# Patient Record
Sex: Female | Born: 1960 | Race: Black or African American | Hispanic: No | Marital: Single | Smoking: Never smoker
Health system: Southern US, Community
[De-identification: ages and names within clinical notes are randomized; demographics above are authoritative.]

## PROBLEM LIST (undated history)

## (undated) DIAGNOSIS — E119 Type 2 diabetes mellitus without complications: Secondary | ICD-10-CM

## (undated) DIAGNOSIS — I10 Essential (primary) hypertension: Secondary | ICD-10-CM

---

## 2006-03-20 ENCOUNTER — Emergency Department (HOSPITAL_COMMUNITY): Admission: EM | Admit: 2006-03-20 | Discharge: 2006-03-20 | Payer: Self-pay | Admitting: Emergency Medicine

## 2006-04-03 ENCOUNTER — Other Ambulatory Visit: Payer: Self-pay

## 2006-04-13 ENCOUNTER — Inpatient Hospital Stay: Payer: Self-pay | Admitting: Unknown Physician Specialty

## 2010-02-11 ENCOUNTER — Ambulatory Visit: Payer: Self-pay | Admitting: Internal Medicine

## 2010-02-17 ENCOUNTER — Ambulatory Visit: Payer: Self-pay | Admitting: Cardiovascular Disease

## 2010-09-08 ENCOUNTER — Ambulatory Visit: Payer: Self-pay | Admitting: Unknown Physician Specialty

## 2010-09-13 ENCOUNTER — Ambulatory Visit: Payer: Self-pay | Admitting: Unknown Physician Specialty

## 2010-09-15 ENCOUNTER — Ambulatory Visit: Payer: Self-pay | Admitting: Unknown Physician Specialty

## 2011-03-28 ENCOUNTER — Ambulatory Visit: Payer: Self-pay | Admitting: Family

## 2011-05-10 ENCOUNTER — Ambulatory Visit: Payer: Self-pay | Admitting: Internal Medicine

## 2012-06-09 ENCOUNTER — Emergency Department: Payer: Self-pay | Admitting: Emergency Medicine

## 2012-06-09 LAB — CBC
HCT: 38.8 % (ref 35.0–47.0)
MCHC: 32.5 g/dL (ref 32.0–36.0)
RBC: 4.54 10*6/uL (ref 3.80–5.20)
RDW: 14.3 % (ref 11.5–14.5)
WBC: 11.4 10*3/uL — ABNORMAL HIGH (ref 3.6–11.0)

## 2012-06-09 LAB — COMPREHENSIVE METABOLIC PANEL
Albumin: 3.9 g/dL (ref 3.4–5.0)
Alkaline Phosphatase: 93 U/L (ref 50–136)
Bilirubin,Total: 0.4 mg/dL (ref 0.2–1.0)
Co2: 34 mmol/L — ABNORMAL HIGH (ref 21–32)
EGFR (Non-African Amer.): 59 — ABNORMAL LOW
Glucose: 111 mg/dL — ABNORMAL HIGH (ref 65–99)
Osmolality: 278 (ref 275–301)
Total Protein: 8.9 g/dL — ABNORMAL HIGH (ref 6.4–8.2)

## 2012-06-09 LAB — URINALYSIS, COMPLETE
Hyaline Cast: 3
Ketone: NEGATIVE
Nitrite: NEGATIVE
Ph: 7 (ref 4.5–8.0)
Protein: NEGATIVE
Specific Gravity: 1.005 (ref 1.003–1.030)

## 2013-10-17 DIAGNOSIS — I10 Essential (primary) hypertension: Secondary | ICD-10-CM | POA: Diagnosis present

## 2018-08-20 ENCOUNTER — Other Ambulatory Visit: Payer: Self-pay | Admitting: Family Medicine

## 2018-08-20 DIAGNOSIS — Z1231 Encounter for screening mammogram for malignant neoplasm of breast: Secondary | ICD-10-CM

## 2018-09-14 ENCOUNTER — Encounter: Payer: Self-pay | Admitting: Radiology

## 2018-09-14 ENCOUNTER — Ambulatory Visit
Admission: RE | Admit: 2018-09-14 | Discharge: 2018-09-14 | Disposition: A | Discharge status: Home / Self Care | Payer: BLUE CROSS/BLUE SHIELD | Source: Ambulatory Visit | Attending: Family Medicine | Admitting: Family Medicine

## 2018-09-14 DIAGNOSIS — Z1231 Encounter for screening mammogram for malignant neoplasm of breast: Secondary | ICD-10-CM

## 2020-04-16 ENCOUNTER — Emergency Department: Payer: Self-pay

## 2020-04-16 ENCOUNTER — Encounter: Payer: Self-pay | Admitting: Emergency Medicine

## 2020-04-16 DIAGNOSIS — R9431 Abnormal electrocardiogram [ECG] [EKG]: Secondary | ICD-10-CM | POA: Insufficient documentation

## 2020-04-16 DIAGNOSIS — Z20822 Contact with and (suspected) exposure to covid-19: Secondary | ICD-10-CM | POA: Insufficient documentation

## 2020-04-16 DIAGNOSIS — R531 Weakness: Secondary | ICD-10-CM | POA: Insufficient documentation

## 2020-04-16 DIAGNOSIS — E1165 Type 2 diabetes mellitus with hyperglycemia: Secondary | ICD-10-CM | POA: Insufficient documentation

## 2020-04-16 DIAGNOSIS — I10 Essential (primary) hypertension: Secondary | ICD-10-CM | POA: Insufficient documentation

## 2020-04-16 DIAGNOSIS — Z91040 Latex allergy status: Secondary | ICD-10-CM | POA: Insufficient documentation

## 2020-04-16 LAB — CBC
HCT: 37 % (ref 36.0–46.0)
Hemoglobin: 12.2 g/dL (ref 12.0–15.0)
MCH: 28.1 pg (ref 26.0–34.0)
MCHC: 33 g/dL (ref 30.0–36.0)
MCV: 85.3 fL (ref 80.0–100.0)
Platelets: 282 10*3/uL (ref 150–400)
RBC: 4.34 MIL/uL (ref 3.87–5.11)
RDW: 13.9 % (ref 11.5–15.5)
WBC: 8.4 10*3/uL (ref 4.0–10.5)
nRBC: 0 % (ref 0.0–0.2)

## 2020-04-16 LAB — TROPONIN I (HIGH SENSITIVITY): Troponin I (High Sensitivity): 7 ng/L (ref <18)

## 2020-04-16 LAB — BASIC METABOLIC PANEL
Anion gap: 12 (ref 5–15)
BUN: 12 mg/dL (ref 6–20)
CO2: 22 mmol/L (ref 22–32)
Calcium: 9.2 mg/dL (ref 8.9–10.3)
Chloride: 104 mmol/L (ref 98–111)
Creatinine, Ser: 0.94 mg/dL (ref 0.44–1.00)
GFR calc Af Amer: >60 mL/min (ref >60)
GFR calc non Af Amer: >60 mL/min (ref >60)
Glucose, Bld: 284 mg/dL — ABNORMAL HIGH (ref 70–99)
Potassium: 3.3 mmol/L — ABNORMAL LOW (ref 3.5–5.1)
Sodium: 138 mmol/L (ref 135–145)

## 2020-04-16 NOTE — ED Triage Notes (Signed)
Pt reports she was transferred by EMS from her PCP to [facility] ER this AM due to EKG changes and hyperglycemia. Pt left AMA from [facility] due to the wait time. Pt reports she has had weakness, dizziness, hyperglycemia and hypertension x3 days. Pt takes metformin only for DM.

## 2020-04-17 ENCOUNTER — Emergency Department
Admission: EM | Admit: 2020-04-17 | Discharge: 2020-04-17 | Disposition: A | Discharge status: Home / Self Care | Payer: Self-pay | Attending: Emergency Medicine | Admitting: Emergency Medicine

## 2020-04-17 DIAGNOSIS — R9431 Abnormal electrocardiogram [ECG] [EKG]: Secondary | ICD-10-CM

## 2020-04-17 DIAGNOSIS — R739 Hyperglycemia, unspecified: Secondary | ICD-10-CM

## 2020-04-17 HISTORY — DX: Essential (primary) hypertension: I10

## 2020-04-17 HISTORY — DX: Type 2 diabetes mellitus without complications: E11.9

## 2020-04-17 LAB — URINALYSIS, COMPLETE (UACMP) WITH MICROSCOPIC
Bacteria, UA: NONE SEEN
Bilirubin Urine: NEGATIVE
Glucose, UA: >=500 mg/dL — AB
Hgb urine dipstick: NEGATIVE
Ketones, ur: 5 mg/dL — AB
Leukocytes,Ua: NEGATIVE
Nitrite: NEGATIVE
Protein, ur: NEGATIVE mg/dL
Specific Gravity, Urine: 1.022 (ref 1.005–1.030)
pH: 7 (ref 5.0–8.0)

## 2020-04-17 LAB — GLUCOSE, CAPILLARY
Glucose-Capillary: 284 mg/dL — ABNORMAL HIGH (ref 70–99)
Glucose-Capillary: 280 mg/dL — ABNORMAL HIGH (ref 70–99)

## 2020-04-17 LAB — TROPONIN I (HIGH SENSITIVITY): Troponin I (High Sensitivity): 7 ng/L (ref <18)

## 2020-04-17 LAB — SARS CORONAVIRUS 2 (TAT 6-24 HRS): SARS Coronavirus 2: NEGATIVE

## 2020-04-17 MED ORDER — LACTATED RINGERS IV BOLUS
1000.0000 mL | Freq: Once | INTRAVENOUS | Status: AC
  Administered 2020-04-17: 1000 mL via INTRAVENOUS

## 2020-04-17 NOTE — ED Provider Notes (Signed)
[facility] Emergency Department Provider Note   ____________________________________________   First MD Initiated Contact with Patient 04/17/20 [phone]     (approximate)  I have reviewed the triage vital signs and the nursing notes.   HISTORY  Chief Complaint Hyperglycemia, Dizziness, and Hypertension    HPI Regina Barry is a 59 y.o. female with past medical history of hypertension and diabetes who presents to the ED complaining of hyperglycemia and weakness.  Patient reports that she has been feeling with it due to frequent posterior 3 days, has noticed that her blood sugar has been elevated since earlier this week.  It has gotten as high as 413 at home and she states she has been compliant with her usual Metformin.  She has not had any fevers but has been feeling weak and malaised with occasional sweating and diarrhea.  She denies any nausea, vomiting, abdominal pain, dysuria, hematuria, cough, chest pain, or shortness of breath.  She has previously been vaccinated for COVID-19 and denies any sick contacts.  She initially presented to walk-in clinic at her PCPs office, who noticed changes on her EKG and referred her to the ED at [facility].  She left the [facility] waiting room prior to being seen.        Past Medical History:  Diagnosis Date  . Diabetes mellitus without complication (HCC)   . Hypertension     There are no problems to display for this patient.   History reviewed. No pertinent surgical history.  Prior to Admission medications   Not on File    Allergies Diflucortolone, Doxycycline, Latex, and Penicillins  Family History  Problem Relation Age of Onset  . Breast cancer Mother 50  . Breast cancer Paternal Aunt     Social History Social History   Tobacco Use  . Smoking status: Never Smoker  . Smokeless tobacco: Never Used  Substance Use Topics  . Alcohol use: Not on file  . Drug use: Not on file    Review of  Systems  Constitutional: No fever/chills.  Positive for generalized weakness and malaise. Eyes: No visual changes. ENT: No sore throat. Cardiovascular: Denies chest pain. Respiratory: Denies shortness of breath. Gastrointestinal: No abdominal pain.  No nausea, no vomiting.  Positive for diarrhea.  No constipation. Genitourinary: Negative for dysuria. Musculoskeletal: Negative for back pain. Skin: Negative for rash. Neurological: Negative for headaches, focal weakness or numbness.  ____________________________________________   PHYSICAL EXAM:  VITAL SIGNS: ED Triage Vitals  Enc Vitals Group     BP 04/16/20 2011 (!) 200/101     Pulse Rate 04/16/20 2011 91     Resp 04/16/20 2011 18     Temp 04/16/20 2011 99 F (37.2 C)     Temp Source 04/16/20 2011 Oral     SpO2 04/16/20 2011 99 %     Weight --      Height --      Head Circumference --      Peak Flow --      Pain Score 04/17/20 0158 4     Pain Loc --      Pain Edu? --      Excl. in GC? --     Constitutional: Alert and oriented. Eyes: Conjunctivae are normal. Head: Atraumatic. Nose: No congestion/rhinnorhea. Mouth/Throat: Mucous membranes are moist. Neck: Normal ROM Cardiovascular: Normal rate, regular rhythm. Grossly normal heart sounds. Respiratory: Normal respiratory effort.  No retractions. Lungs CTAB. Gastrointestinal: Soft and nontender. No distention. Genitourinary: deferred Musculoskeletal: No lower  extremity tenderness nor edema. Neurologic:  Normal speech and language. No gross focal neurologic deficits are appreciated. Skin:  Skin is warm, dry and intact. No rash noted. Psychiatric: Mood and affect are normal. Speech and behavior are normal.  ____________________________________________   LABS (all labs ordered are listed, but only abnormal results are displayed)  Labs Reviewed  BASIC METABOLIC PANEL - Abnormal; Notable for the following components:      Result Value   Potassium 3.3 (*)    Glucose,  Bld 284 (*)    All other components within normal limits  GLUCOSE, CAPILLARY - Abnormal; Notable for the following components:   Glucose-Capillary 284 (*)    All other components within normal limits  URINALYSIS, COMPLETE (UACMP) WITH MICROSCOPIC - Abnormal; Notable for the following components:   Color, Urine YELLOW (*)    APPearance CLOUDY (*)    Glucose, UA >=500 (*)    Ketones, ur 5 (*)    All other components within normal limits  SARS CORONAVIRUS 2 (TAT 6-24 HRS)  CBC  TROPONIN I (HIGH SENSITIVITY)  TROPONIN I (HIGH SENSITIVITY)  TROPONIN I (HIGH SENSITIVITY)   ____________________________________________  EKG  ED ECG REPORT I, Chesley Noon, the attending physician, personally viewed and interpreted this ECG.   Date: 04/17/2020  EKG Time: 20:14  Rate: 91  Rhythm: normal sinus rhythm  Axis: Normal  Intervals:none  ST&T Change: Lateral T wave inversions   PROCEDURES  Procedure(s) performed (including Critical Care):  Procedures   ____________________________________________   INITIAL IMPRESSION / ASSESSMENT AND PLAN / ED COURSE       59 year old female with past medical history of hypertension and diabetes presents to the ED complaining of generalized weakness, malaise, diarrhea, and hyperglycemia noted over the past 2 to 3 days.  She denies any fevers but has felt hot and sweaty at times.  Vital signs are reassuring here in the ED, lab work remarkable for mild hyperglycemia but otherwise reassuring.  EKG does show T wave inversions in lateral leads, however there is no recent EKG for comparison and this may be a chronic finding given patient denies any anginal symptoms and initial troponin is negative.  We will trend second troponin, hydrate with IV fluids, and check UA.  If this is unremarkable then I suspect viral illness could be contributing to patient's hyperglycemia, we will test for COVID-19.  Repeat troponin within normal limits, UA is unremarkable.   Patient is appropriate for discharge home with PCP follow-up for hyperglycemia, was counseled to return to the ED for new worsening symptoms.  Patient agrees with plan.      ____________________________________________   FINAL CLINICAL IMPRESSION(S) / ED DIAGNOSES  Final diagnoses:  Hyperglycemia  Abnormal EKG     ED Discharge Orders    None       Note:  This document was prepared using Dragon voice recognition software and may include unintentional dictation errors.   Chesley Noon, MD 04/17/20 [phone]

## 2020-04-17 NOTE — ED Notes (Signed)
Pt given warm blanket.

## 2020-09-08 ENCOUNTER — Other Ambulatory Visit: Payer: Self-pay | Admitting: Family Medicine

## 2020-09-08 DIAGNOSIS — Z1231 Encounter for screening mammogram for malignant neoplasm of breast: Secondary | ICD-10-CM

## 2020-09-10 ENCOUNTER — Other Ambulatory Visit: Payer: Self-pay

## 2020-09-10 ENCOUNTER — Ambulatory Visit
Admission: RE | Admit: 2020-09-10 | Discharge: 2020-09-10 | Disposition: A | Discharge status: Home / Self Care | Payer: BC Managed Care – PPO | Source: Ambulatory Visit | Attending: Family Medicine | Admitting: Family Medicine

## 2020-09-10 DIAGNOSIS — Z1231 Encounter for screening mammogram for malignant neoplasm of breast: Secondary | ICD-10-CM | POA: Insufficient documentation

## 2021-02-23 DIAGNOSIS — E119 Type 2 diabetes mellitus without complications: Secondary | ICD-10-CM | POA: Insufficient documentation

## 2021-02-23 DIAGNOSIS — R079 Chest pain, unspecified: Secondary | ICD-10-CM | POA: Diagnosis not present

## 2021-02-23 DIAGNOSIS — Z91040 Latex allergy status: Secondary | ICD-10-CM | POA: Insufficient documentation

## 2021-02-23 DIAGNOSIS — R519 Headache, unspecified: Secondary | ICD-10-CM | POA: Insufficient documentation

## 2021-02-23 DIAGNOSIS — I10 Essential (primary) hypertension: Secondary | ICD-10-CM | POA: Diagnosis not present

## 2021-02-23 DIAGNOSIS — R55 Syncope and collapse: Secondary | ICD-10-CM | POA: Insufficient documentation

## 2021-02-23 DIAGNOSIS — Z20822 Contact with and (suspected) exposure to covid-19: Secondary | ICD-10-CM | POA: Diagnosis not present

## 2021-02-23 DIAGNOSIS — R0602 Shortness of breath: Secondary | ICD-10-CM | POA: Insufficient documentation

## 2021-02-24 ENCOUNTER — Emergency Department
Admission: EM | Admit: 2021-02-24 | Discharge: 2021-02-24 | Disposition: A | Discharge status: Home / Self Care | Payer: BC Managed Care – PPO | Attending: Emergency Medicine | Admitting: Emergency Medicine

## 2021-02-24 ENCOUNTER — Emergency Department: Payer: BC Managed Care – PPO

## 2021-02-24 ENCOUNTER — Other Ambulatory Visit: Payer: Self-pay

## 2021-02-24 DIAGNOSIS — R55 Syncope and collapse: Secondary | ICD-10-CM

## 2021-02-24 DIAGNOSIS — E860 Dehydration: Secondary | ICD-10-CM

## 2021-02-24 LAB — HEPATIC FUNCTION PANEL
ALT: 12 U/L (ref 0–44)
AST: 11 U/L — ABNORMAL LOW (ref 15–41)
Albumin: 3.7 g/dL (ref 3.5–5.0)
Alkaline Phosphatase: 60 U/L (ref 38–126)
Bilirubin, Direct: <0.1 mg/dL (ref 0.0–0.2)
Total Bilirubin: 0.6 mg/dL (ref 0.3–1.2)
Total Protein: 7.1 g/dL (ref 6.5–8.1)

## 2021-02-24 LAB — CBC WITH DIFFERENTIAL/PLATELET
Abs Immature Granulocytes: 0.02 10*3/uL (ref 0.00–0.07)
Basophils Absolute: 0 10*3/uL (ref 0.0–0.1)
Basophils Relative: 1 %
Eosinophils Absolute: 0.1 10*3/uL (ref 0.0–0.5)
Eosinophils Relative: 2 %
HCT: 36.2 % (ref 36.0–46.0)
Hemoglobin: 11.6 g/dL — ABNORMAL LOW (ref 12.0–15.0)
Immature Granulocytes: 0 %
Lymphocytes Relative: 47 %
Lymphs Abs: 3.9 10*3/uL (ref 0.7–4.0)
MCH: 27.2 pg (ref 26.0–34.0)
MCHC: 32 g/dL (ref 30.0–36.0)
MCV: 85 fL (ref 80.0–100.0)
Monocytes Absolute: 0.4 10*3/uL (ref 0.1–1.0)
Monocytes Relative: 5 %
Neutro Abs: 3.6 10*3/uL (ref 1.7–7.7)
Neutrophils Relative %: 45 %
Platelets: 284 10*3/uL (ref 150–400)
RBC: 4.26 MIL/uL (ref 3.87–5.11)
RDW: 14.7 % (ref 11.5–15.5)
WBC: 8.1 10*3/uL (ref 4.0–10.5)
nRBC: 0 % (ref 0.0–0.2)

## 2021-02-24 LAB — URINALYSIS, COMPLETE (UACMP) WITH MICROSCOPIC
Bilirubin Urine: NEGATIVE
Glucose, UA: 50 mg/dL — AB
Hgb urine dipstick: NEGATIVE
Ketones, ur: NEGATIVE mg/dL
Leukocytes,Ua: NEGATIVE
Nitrite: NEGATIVE
Protein, ur: 30 mg/dL — AB
Specific Gravity, Urine: 1.033 — ABNORMAL HIGH (ref 1.005–1.030)
pH: 5 (ref 5.0–8.0)

## 2021-02-24 LAB — BASIC METABOLIC PANEL
Anion gap: 11 (ref 5–15)
BUN: 15 mg/dL (ref 6–20)
CO2: 19 mmol/L — ABNORMAL LOW (ref 22–32)
Calcium: 9.3 mg/dL (ref 8.9–10.3)
Chloride: 107 mmol/L (ref 98–111)
Creatinine, Ser: 1.1 mg/dL — ABNORMAL HIGH (ref 0.44–1.00)
GFR, Estimated: 59 mL/min — ABNORMAL LOW (ref >60)
Glucose, Bld: 327 mg/dL — ABNORMAL HIGH (ref 70–99)
Potassium: 3.4 mmol/L — ABNORMAL LOW (ref 3.5–5.1)
Sodium: 137 mmol/L (ref 135–145)

## 2021-02-24 LAB — MAGNESIUM: Magnesium: 1.9 mg/dL (ref 1.7–2.4)

## 2021-02-24 LAB — TROPONIN I (HIGH SENSITIVITY)
Troponin I (High Sensitivity): 6 ng/L (ref <18)
Troponin I (High Sensitivity): 6 ng/L (ref <18)

## 2021-02-24 LAB — RESP PANEL BY RT-PCR (FLU A&B, COVID) ARPGX2
Influenza A by PCR: NEGATIVE
Influenza B by PCR: NEGATIVE
SARS Coronavirus 2 by RT PCR: NEGATIVE

## 2021-02-24 LAB — BRAIN NATRIURETIC PEPTIDE: B Natriuretic Peptide: 7.5 pg/mL (ref 0.0–100.0)

## 2021-02-24 LAB — D-DIMER, QUANTITATIVE: D-Dimer, Quant: 0.87 ug/mL-FEU — ABNORMAL HIGH (ref 0.00–0.50)

## 2021-02-24 MED ORDER — ACETAMINOPHEN 500 MG PO TABS
1000.0000 mg | ORAL_TABLET | Freq: Once | ORAL | Status: AC
  Administered 2021-02-24: 1000 mg via ORAL
  Filled 2021-02-24: qty 2

## 2021-02-24 MED ORDER — HYDROCODONE BIT-HOMATROP MBR 5-1.5 MG/5ML PO SOLN: 5.0000 mL | Freq: Four times a day (QID) | ORAL | 0 refills | Status: DC | PRN

## 2021-02-24 MED ORDER — SODIUM CHLORIDE 0.9 % IV BOLUS
1000.0000 mL | Freq: Once | INTRAVENOUS | Status: AC
  Administered 2021-02-24: 1000 mL via INTRAVENOUS

## 2021-02-24 MED ORDER — POTASSIUM CHLORIDE CRYS ER 20 MEQ PO TBCR
40.0000 meq | EXTENDED_RELEASE_TABLET | Freq: Once | ORAL | Status: AC
  Administered 2021-02-24: 40 meq via ORAL
  Filled 2021-02-24: qty 2

## 2021-02-24 MED ORDER — TECHNETIUM TO 99M ALBUMIN AGGREGATED
4.2000 | Freq: Once | INTRAVENOUS | Status: AC | PRN
  Administered 2021-02-24: 4.2 via INTRAVENOUS

## 2021-02-24 MED ORDER — BENZONATATE 100 MG PO CAPS: 100.0000 mg | ORAL_CAPSULE | Freq: Three times a day (TID) | ORAL | 0 refills | Status: AC | PRN

## 2021-02-24 NOTE — ED Notes (Signed)
Pt being transported to nuc med at this time

## 2021-02-24 NOTE — Discharge Instructions (Addendum)
Follow-up with cardiology to discuss Holter and or echo for your episode.  If develop worsening shortness of breath or repeat passing out please return to the ER medially  Hold your LASIX for the next 2 days  Drink AT LEAST 6-8 glasses of water daily  Take the whole course of your antibiotic  I've prescribed something for cough

## 2021-02-24 NOTE — ED Provider Notes (Signed)
[facility] Emergency Department Provider Note  ____________________________________________   Event Date/Time   First MD Initiated Contact with Patient 02/24/21 [phone]     (approximate)  I have reviewed the triage vital signs and the nursing notes.   HISTORY  Chief Complaint Loss of Consciousness    HPI Regina Barry is a 60 y.o. female with diabetes, hypertension who comes in with syncopal episode.  Patient was recently diagnosed with pneumonia is currently taking Levaquin.  Patient states that she was diagnosed after a primary doctor heard that she was having coughing for 3 weeks and listen to her lungs.  No imaging was ordered.  Patient reports that today she had gone to the bathroom only urination and she started to feel unwell.  She was walking and felt like she was going to pass out and had some chest pain, shortness of breath.  She states that she had brief LOC where she crumpled down to the ground.  Did not hit her head.  She does report a mild headache but states that it been ever since she took the Levaquin.  No change in her headache since then.  No blurry vision.  No hematoma on her head.  No neck pain.  She states that she felt like her legs lost their color.  She denies any urinary self, tongue biting, shaking activity          Past Medical History:  Diagnosis Date  . Diabetes mellitus without complication (HCC)   . Hypertension     There are no problems to display for this patient.   History reviewed. No pertinent surgical history.  Prior to Admission medications   Not on File    Allergies Diflucortolone, Doxycycline, Latex, and Penicillins  Family History  Problem Relation Age of Onset  . Breast cancer Mother 3  . Breast cancer Paternal Aunt   . Breast cancer Cousin     Social History Social History   Tobacco Use  . Smoking status: Never Smoker  . Smokeless tobacco: Never Used      Review of  Systems Constitutional: No fever/chills + syncope  Eyes: No visual changes. ENT: No sore throat. Cardiovascular: Positive chest pain Respiratory: + sob  Gastrointestinal: No abdominal pain.  No nausea, no vomiting.  No diarrhea.  No constipation. Genitourinary: Negative for dysuria. Musculoskeletal: Negative for back pain. Skin: Negative for rash. Neurological: + headache, no focal weakness or numbness. All other ROS negative ____________________________________________   PHYSICAL EXAM:  VITAL SIGNS: ED Triage Vitals  Enc Vitals Group     BP 02/24/21 0007 (!) 157/86     Pulse Rate 02/24/21 0007 92     Resp 02/24/21 0007 18     Temp 02/24/21 0007 98.7 F (37.1 C)     Temp src --      SpO2 02/24/21 0007 97 %     Weight 02/24/21 0005 212 lb (96.2 kg)     Height 02/24/21 0005 5\' 4"  (1.626 m)     Head Circumference --      Peak Flow --      Pain Score 02/24/21 0005 4     Pain Loc --      Pain Edu? --      Excl. in GC? --     Constitutional: Alert and oriented. Well appearing and in no acute distress. Eyes: Conjunctivae are normal. EOMI. Head: Atraumatic. Nose: No congestion/rhinnorhea. Mouth/Throat: Mucous membranes are moist.   Neck: No stridor. Trachea Midline.  FROM Cardiovascular: Normal rate, regular rhythm. Grossly normal heart sounds.  Good peripheral circulation. Respiratory: Normal respiratory effort.  No retractions. Lungs CTAB. Gastrointestinal: Soft and nontender. No distention. No abdominal bruits.  Musculoskeletal: No lower extremity tenderness nor edema.  No joint effusions.  2+ distal pulses in her feet.  No color change.  No calf swelling or pain Neurologic:  Normal speech and language. No gross focal neurologic deficits are appreciated.  Equal strength in arms and legs. Skin:  Skin is warm, dry and intact. No rash noted. Psychiatric: Mood and affect are normal. Speech and behavior are normal. GU: Deferred   ____________________________________________    LABS (all labs ordered are listed, but only abnormal results are displayed)  Labs Reviewed  BASIC METABOLIC PANEL - Abnormal; Notable for the following components:      Result Value   Potassium 3.4 (*)    CO2 19 (*)    Glucose, Bld 327 (*)    Creatinine, Ser 1.10 (*)    GFR, Estimated 59 (*)    All other components within normal limits  CBC WITH DIFFERENTIAL/PLATELET - Abnormal; Notable for the following components:   Hemoglobin 11.6 (*)    All other components within normal limits  MAGNESIUM  URINALYSIS, COMPLETE (UACMP) WITH MICROSCOPIC  CBG MONITORING, ED  POC URINE PREG, ED   ____________________________________________   ED ECG REPORT I, Concha Se, the attending physician, personally viewed and interpreted this ECG.  Normal sinus rate of 94, no ST elevation, T wave version aVL, normal intervals ____________________________________________  RADIOLOGY Vela Prose, personally viewed and evaluated these images (plain radiographs) as part of my medical decision making, as well as reviewing the written report by the radiologist.  ED MD interpretation: No pneumonia  Official radiology report(s): DG Chest 2 View  Result Date: 02/24/2021 CLINICAL DATA:  Shortness of breath. EXAM: CHEST - 2 VIEW COMPARISON:  Chest x-ray 04/16/2020 FINDINGS: The heart size and mediastinal contours are within normal limits. Slightly low lung volumes. No focal consolidation. No pulmonary edema. No pleural effusion. No pneumothorax. No acute osseous abnormality. IMPRESSION: No active cardiopulmonary disease. Electronically Signed   By: Tish Frederickson M.D.   On: 02/24/2021 04:18    ____________________________________________   PROCEDURES  Procedure(s) performed (including Critical Care):  .1-3 Lead EKG Interpretation Performed by: Concha Se, MD Authorized by: Concha Se, MD     Interpretation: normal     ECG rate:  70s    ECG rate assessment: normal     Rhythm: sinus  rhythm     Ectopy: none     Conduction: normal       ____________________________________________   INITIAL IMPRESSION / ASSESSMENT AND PLAN / ED COURSE   Regina Barry was evaluated in Emergency Department on 02/24/2021 for the symptoms described in the history of present illness. She was evaluated in the context of the global COVID-19 pandemic, which necessitated consideration that the patient might be at risk for infection with the SARS-CoV-2 virus that causes COVID-19. Institutional protocols and algorithms that pertain to the evaluation of patients at risk for COVID-19 are in a state of rapid change based on information released by regulatory bodies including the CDC and federal and state organizations. These policies and algorithms were followed during the patient's care in the ED.    Most Likely DDx:  Patient comes in with syncopal episode in the setting of being recently treated for pneumonia with Levaquin.  Will get cardiac markers to evaluate for  ACS, D-dimer to evaluate for PE given patient reports shortness of breath and changes in colors in her legs.  At this time there is no unilateral leg swelling to suggest DVT.  Will get COVID swab.  Pulses feel good in her feet and they look well perfused with normal cap refill.  No symptoms to suggest seizures   DDx that was also considered d/t potential to cause harm, but was found less likely based on history and physical (as detailed above): -PNX (reassured with equal b/l breath sounds, CXR to evaluate) -Symptomatic anemia (will get H&H) -Aortic Dissection as no tearing pain and no radiation to the mid back, pulses equal -Pericarditis no rub on exam, EKG changes or hx to suggest dx -Tamponade (no notable SOB, tachycardic, hypotensive) -Esophageal rupture (no h/o diffuse vomitting/no crepitus)  Labs are reassuring.  Hemoglobin slightly low but similar to baseline.  Magnesium is normal.  Potassium slightly low will give some  repletion.  Glucose was 327 and kidney function slightly elevated  Patient's COVID is negative.  X-ray was without evidence of pneumonia.  But D-dimer was elevated therefore we will proceed with VQ scan given contrast shortage.  Patient states that she does feel little short of breath when she continues to walk but does not feel like she is going to pass out any longer.  Discussed with patient different options including admission for echo and cardiac monitoring versus following up outpatient with cardiology.  Given this is in the setting of a recently treated illness I suspect that it could just be from some mild dehydration and from being ill.  Patient states that she would rather follow-up outpatient as long as work-up is otherwise reassuring.  Patient will be handed off to oncoming team pending imaging.  Patient reports that her headache has since resolved      ____________________________________________   FINAL CLINICAL IMPRESSION(S) / ED DIAGNOSES   Final diagnoses:  Syncope, unspecified syncope type     MEDICATIONS GIVEN DURING THIS VISIT:  Medications  sodium chloride 0.9 % bolus 1,000 mL (0 mLs Intravenous Stopped 02/24/21 0710)  potassium chloride SA (KLOR-CON) CR tablet 40 mEq (40 mEq Oral Given 02/24/21 0453)  acetaminophen (TYLENOL) tablet 1,000 mg (1,000 mg Oral Given 02/24/21 0454)     ED Discharge Orders    None       Note:  This document was prepared using Dragon voice recognition software and may include unintentional dictation errors.   Concha Se, MD 02/24/21 [phone]

## 2021-02-24 NOTE — ED Provider Notes (Signed)
59 year old female here with syncopal episode in the setting of suspected orthostasis and dehydration, likely related to recent URI and dehydration.  The patient is hemodynamically stable and in no acute distress.  She is well-appearing.  Lab work shows hyperglycemia and dehydration without otherwise any acute abnormalities.  D-dimer positive but V/Q sent and is negative.  Patient will be treated for dehydration and likely orthostasis.  Will give her a brief course of antitussives given her complaints of progressive and more severe coughing at home.  Return precautions given.   Shaune Pollack, MD 02/24/21 1043

## 2021-02-24 NOTE — ED Triage Notes (Signed)
Pt to ED via EMS from home. Pt states she had a syncopal episode tonight. Pt was recently dx with pneumonia and is currently taking Levaquin. Pt has hx of HTN and pt states has been having more difficult of a time controlling it while on the Levaquin. Pt also c/o headache.

## 2021-02-24 NOTE — ED Notes (Signed)
Lab contacted to complete urine preg test on urine sent down by previous RN

## 2021-06-30 DIAGNOSIS — C259 Malignant neoplasm of pancreas, unspecified: Secondary | ICD-10-CM | POA: Diagnosis present

## 2021-09-02 ENCOUNTER — Other Ambulatory Visit: Payer: Self-pay | Admitting: Family Medicine

## 2021-09-02 DIAGNOSIS — Z1231 Encounter for screening mammogram for malignant neoplasm of breast: Secondary | ICD-10-CM

## 2021-09-13 ENCOUNTER — Other Ambulatory Visit: Payer: Self-pay

## 2021-09-13 ENCOUNTER — Ambulatory Visit
Admission: RE | Admit: 2021-09-13 | Discharge: 2021-09-13 | Disposition: A | Discharge status: Home / Self Care | Payer: BC Managed Care – PPO | Source: Ambulatory Visit | Attending: Family Medicine | Admitting: Family Medicine

## 2021-09-13 DIAGNOSIS — Z1231 Encounter for screening mammogram for malignant neoplasm of breast: Secondary | ICD-10-CM | POA: Diagnosis not present

## 2022-05-16 DIAGNOSIS — F32A Depression, unspecified: Secondary | ICD-10-CM | POA: Insufficient documentation

## 2022-05-16 DIAGNOSIS — F419 Anxiety disorder, unspecified: Secondary | ICD-10-CM | POA: Insufficient documentation

## 2022-09-13 DIAGNOSIS — R92323 Mammographic fibroglandular density, bilateral breasts: Secondary | ICD-10-CM | POA: Diagnosis not present

## 2022-09-13 DIAGNOSIS — Z1231 Encounter for screening mammogram for malignant neoplasm of breast: Secondary | ICD-10-CM | POA: Diagnosis not present

## 2022-10-06 IMAGING — MG MM DIGITAL SCREENING BILAT W/ TOMO AND CAD
6 of 10 series · 6 of 30 positions shown · non-contrast
Comparison: Previous exam(s).

CLINICAL DATA: Screening.

EXAM:
DIGITAL SCREENING BILATERAL MAMMOGRAM WITH TOMOSYNTHESIS AND CAD
TECHNIQUE: Bilateral screening digital craniocaudal and mediolateral oblique
mammograms were obtained. Bilateral screening digital breast
tomosynthesis was performed. The images were evaluated with
computer-aided detection.

[R MLO synth-2D (1 of 2)]
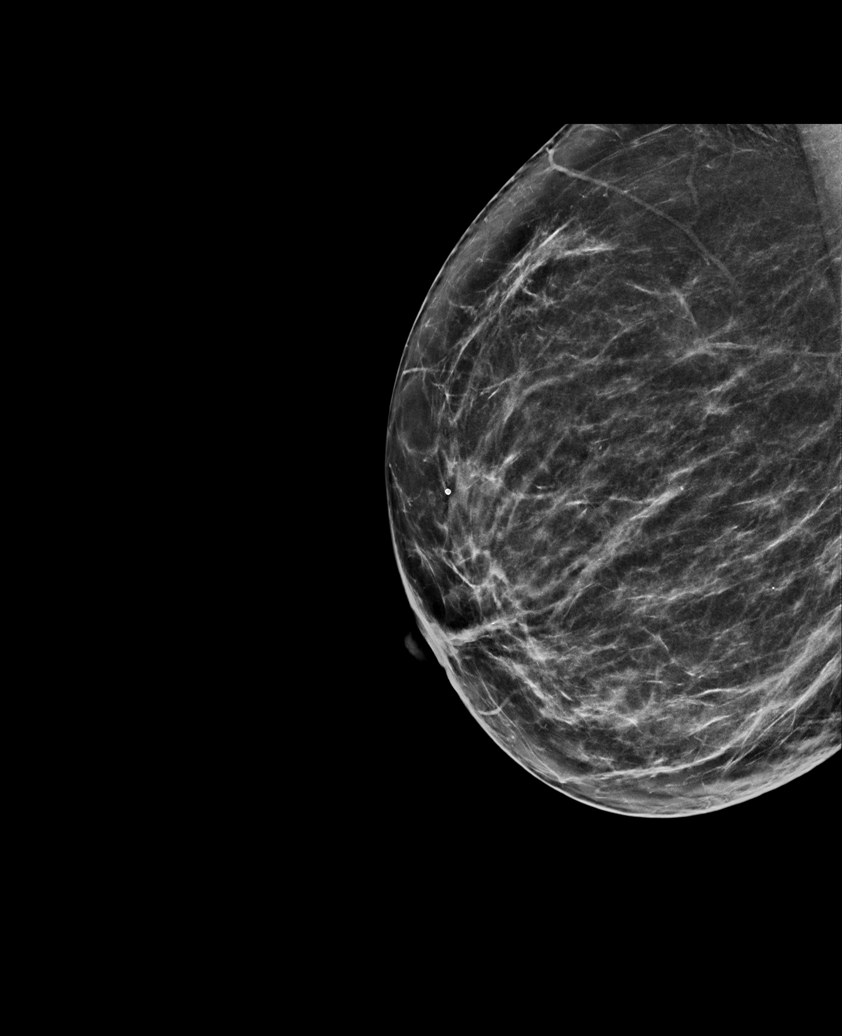

[L MLO synth-2D]
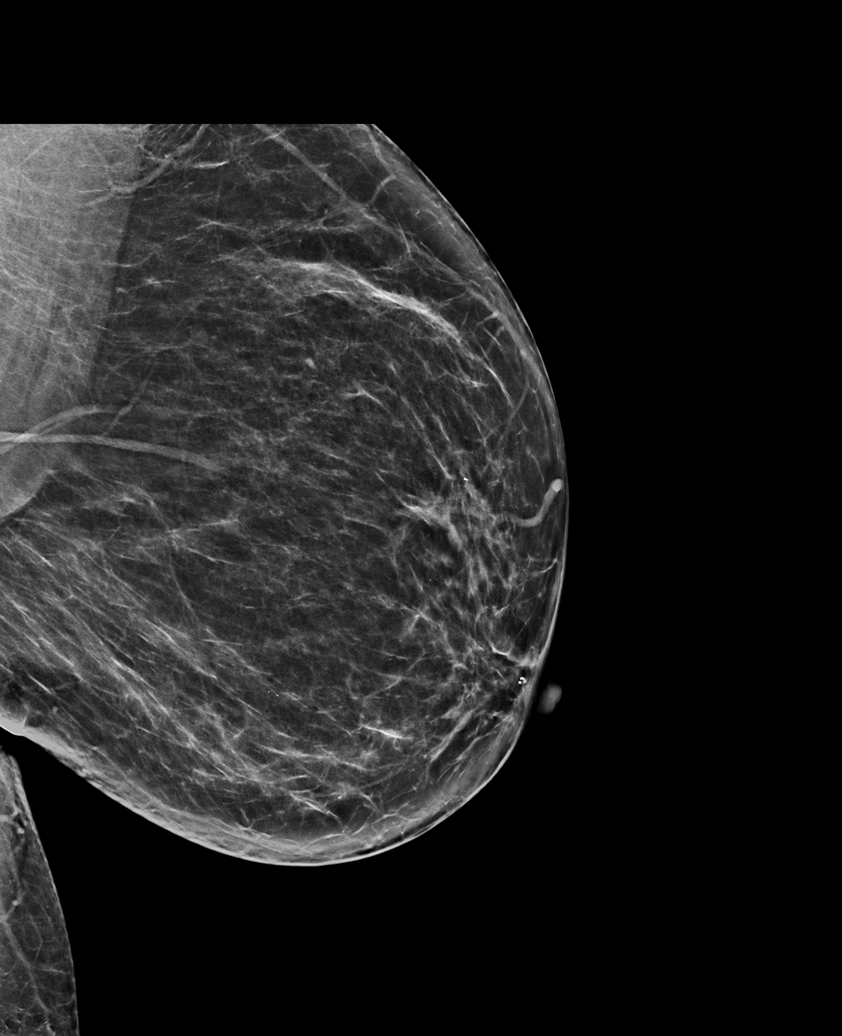

[L CC synth-2D]
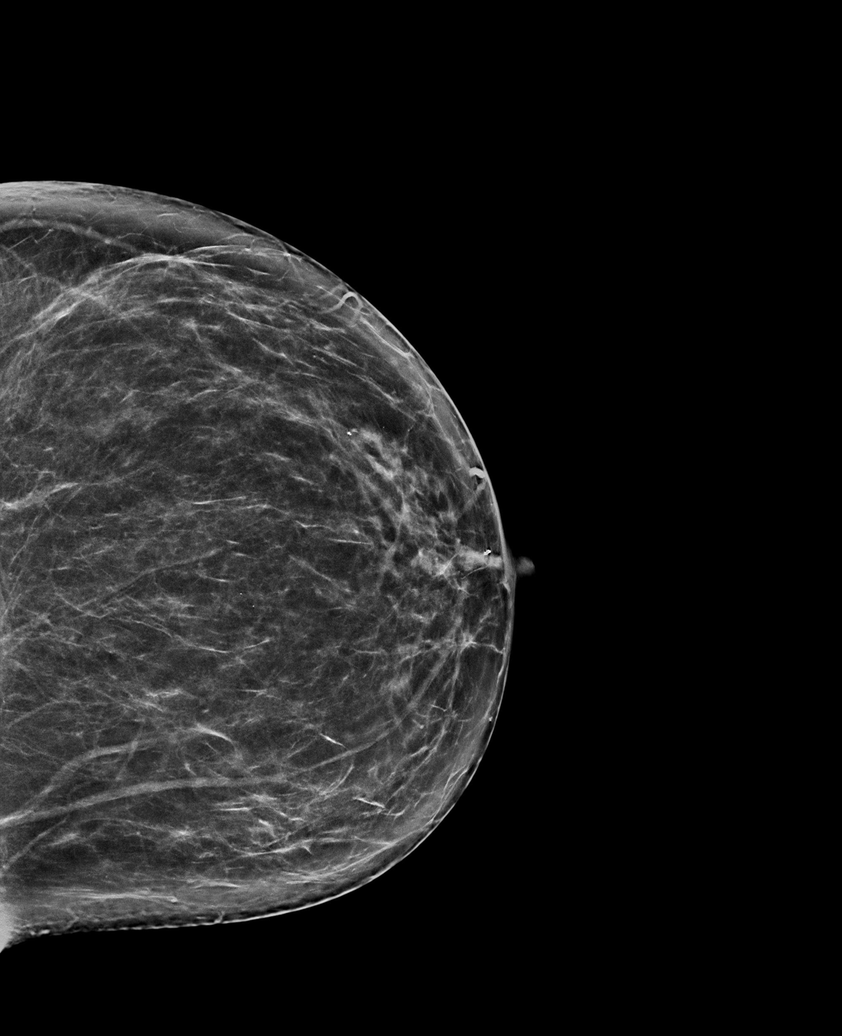

[R MLO synth-2D (2 of 2)]
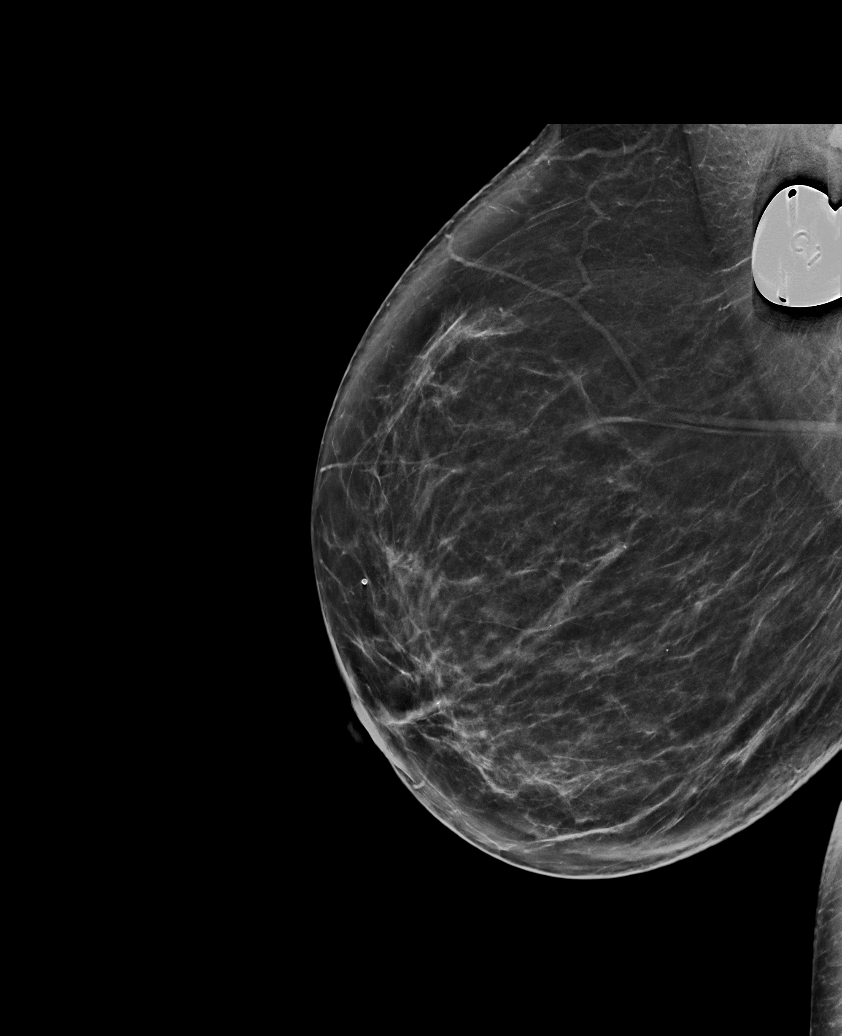

[R CC synth-2D]
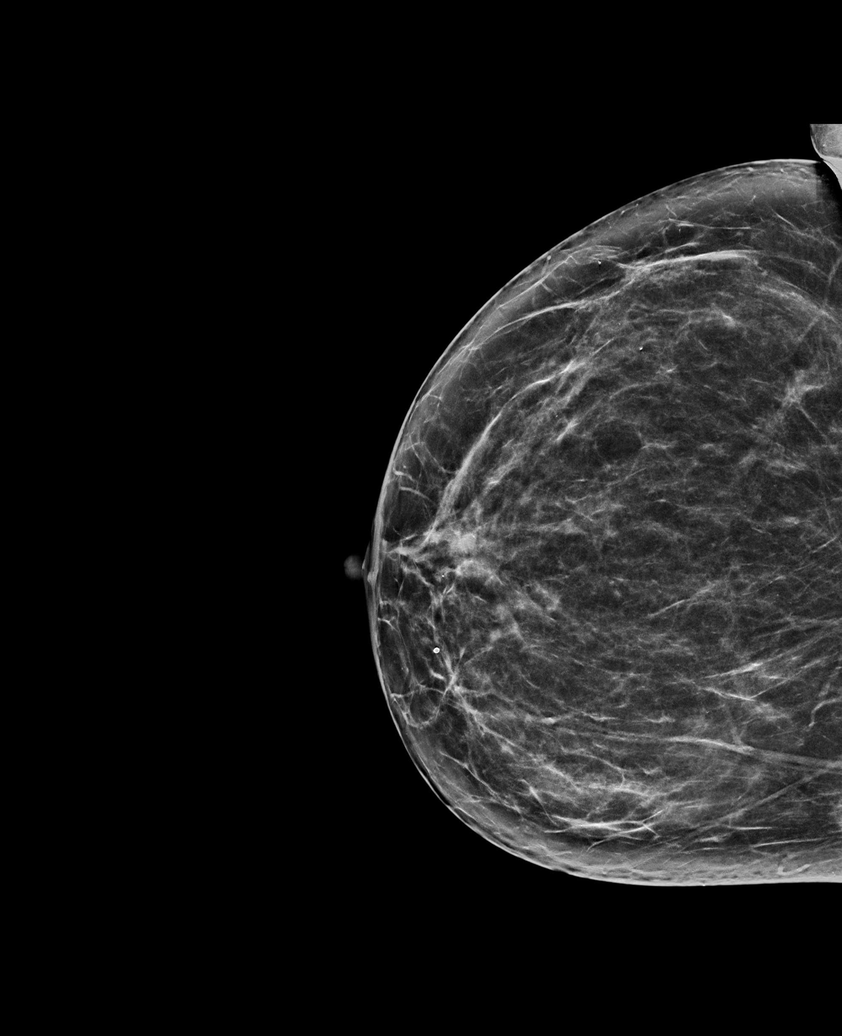

[R MLO tomo · tomo slice 37/74.0]
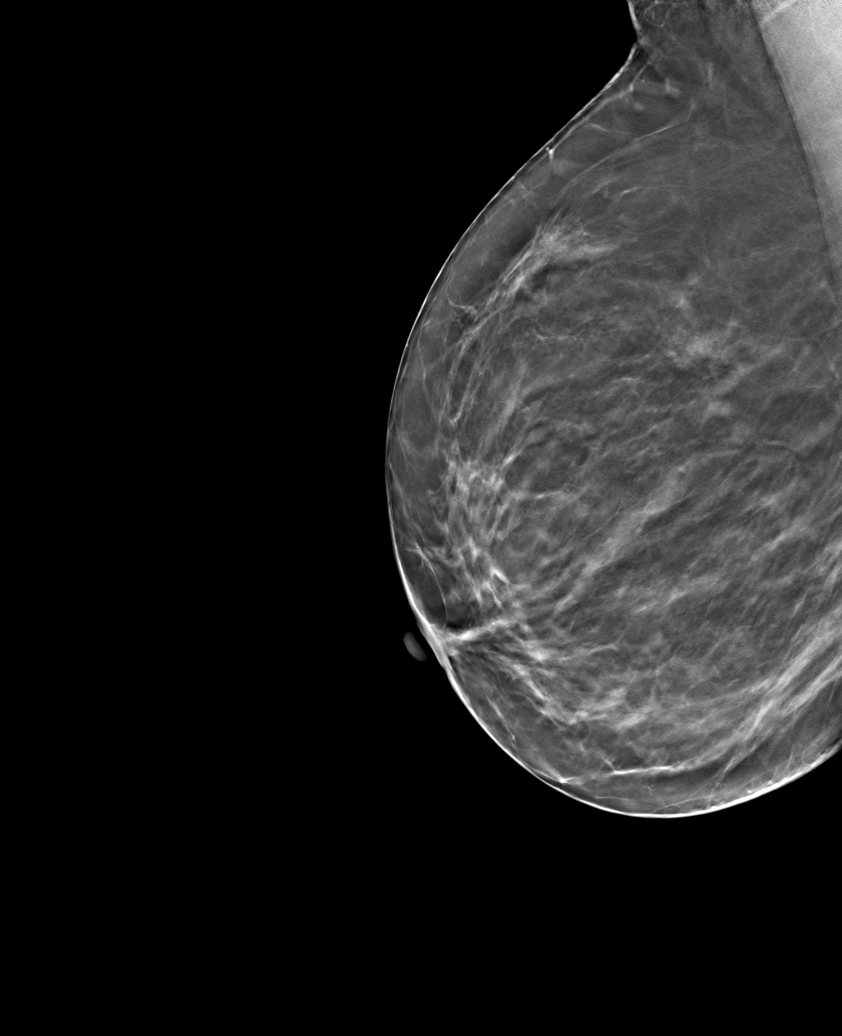

[6 of 30 positions shown; findings below may reference images not displayed]

ACR Breast Density Category b: There are scattered areas of
fibroglandular density.
FINDINGS: There are no findings suspicious for malignancy.
IMPRESSION: No mammographic evidence of malignancy. A result letter of this
screening mammogram will be mailed directly to the patient.

RECOMMENDATION:
Screening mammogram in one year. (Code:51-O-LD2)

BI-RADS CATEGORY  1: Negative.

## 2022-11-07 DIAGNOSIS — K669 Disorder of peritoneum, unspecified: Secondary | ICD-10-CM | POA: Diagnosis not present

## 2022-11-07 DIAGNOSIS — C259 Malignant neoplasm of pancreas, unspecified: Secondary | ICD-10-CM | POA: Diagnosis not present

## 2023-01-17 DIAGNOSIS — E119 Type 2 diabetes mellitus without complications: Secondary | ICD-10-CM | POA: Diagnosis not present

## 2023-01-17 DIAGNOSIS — D649 Anemia, unspecified: Secondary | ICD-10-CM | POA: Diagnosis not present

## 2023-01-17 DIAGNOSIS — K6289 Other specified diseases of anus and rectum: Secondary | ICD-10-CM | POA: Diagnosis not present

## 2023-01-17 DIAGNOSIS — R9341 Abnormal radiologic findings on diagnostic imaging of renal pelvis, ureter, or bladder: Secondary | ICD-10-CM | POA: Diagnosis not present

## 2023-01-17 DIAGNOSIS — R7989 Other specified abnormal findings of blood chemistry: Secondary | ICD-10-CM | POA: Diagnosis not present

## 2023-01-17 DIAGNOSIS — R52 Pain, unspecified: Secondary | ICD-10-CM | POA: Diagnosis not present

## 2023-01-17 DIAGNOSIS — C259 Malignant neoplasm of pancreas, unspecified: Secondary | ICD-10-CM | POA: Diagnosis not present

## 2023-01-17 DIAGNOSIS — Z79899 Other long term (current) drug therapy: Secondary | ICD-10-CM | POA: Diagnosis not present

## 2023-01-17 DIAGNOSIS — C252 Malignant neoplasm of tail of pancreas: Secondary | ICD-10-CM | POA: Diagnosis not present

## 2023-01-19 DIAGNOSIS — C259 Malignant neoplasm of pancreas, unspecified: Secondary | ICD-10-CM | POA: Diagnosis not present

## 2023-01-19 DIAGNOSIS — E1165 Type 2 diabetes mellitus with hyperglycemia: Secondary | ICD-10-CM | POA: Diagnosis not present

## 2023-01-19 DIAGNOSIS — L299 Pruritus, unspecified: Secondary | ICD-10-CM | POA: Diagnosis not present

## 2023-01-25 ENCOUNTER — Telehealth: Payer: Self-pay

## 2023-01-25 NOTE — Telephone Encounter (Signed)
LVM for patient to call back to schedule apt. AS, CMA 

## 2023-01-26 DIAGNOSIS — Z794 Long term (current) use of insulin: Secondary | ICD-10-CM | POA: Diagnosis not present

## 2023-01-26 DIAGNOSIS — Z87440 Personal history of urinary (tract) infections: Secondary | ICD-10-CM | POA: Diagnosis not present

## 2023-01-26 DIAGNOSIS — E1165 Type 2 diabetes mellitus with hyperglycemia: Secondary | ICD-10-CM | POA: Diagnosis not present

## 2023-01-26 DIAGNOSIS — C252 Malignant neoplasm of tail of pancreas: Secondary | ICD-10-CM | POA: Diagnosis not present

## 2023-01-26 DIAGNOSIS — Z90721 Acquired absence of ovaries, unilateral: Secondary | ICD-10-CM | POA: Diagnosis not present

## 2023-01-26 DIAGNOSIS — Z7984 Long term (current) use of oral hypoglycemic drugs: Secondary | ICD-10-CM | POA: Diagnosis not present

## 2023-01-26 DIAGNOSIS — Z90710 Acquired absence of both cervix and uterus: Secondary | ICD-10-CM | POA: Diagnosis not present

## 2023-01-26 DIAGNOSIS — Z79899 Other long term (current) drug therapy: Secondary | ICD-10-CM | POA: Diagnosis not present

## 2023-02-07 DIAGNOSIS — E1165 Type 2 diabetes mellitus with hyperglycemia: Secondary | ICD-10-CM | POA: Diagnosis not present

## 2023-02-07 DIAGNOSIS — C259 Malignant neoplasm of pancreas, unspecified: Secondary | ICD-10-CM | POA: Diagnosis not present

## 2023-02-07 DIAGNOSIS — R0609 Other forms of dyspnea: Secondary | ICD-10-CM | POA: Diagnosis not present

## 2023-02-07 DIAGNOSIS — K8689 Other specified diseases of pancreas: Secondary | ICD-10-CM | POA: Diagnosis not present

## 2023-02-07 DIAGNOSIS — R52 Pain, unspecified: Secondary | ICD-10-CM | POA: Diagnosis not present

## 2023-02-07 DIAGNOSIS — R1310 Dysphagia, unspecified: Secondary | ICD-10-CM | POA: Diagnosis not present

## 2023-02-07 DIAGNOSIS — C252 Malignant neoplasm of tail of pancreas: Secondary | ICD-10-CM | POA: Diagnosis not present

## 2023-02-07 DIAGNOSIS — T451X5A Adverse effect of antineoplastic and immunosuppressive drugs, initial encounter: Secondary | ICD-10-CM | POA: Diagnosis not present

## 2023-02-07 DIAGNOSIS — D649 Anemia, unspecified: Secondary | ICD-10-CM | POA: Diagnosis not present

## 2023-02-07 DIAGNOSIS — R110 Nausea: Secondary | ICD-10-CM | POA: Diagnosis not present

## 2023-02-07 DIAGNOSIS — Z5111 Encounter for antineoplastic chemotherapy: Secondary | ICD-10-CM | POA: Diagnosis not present

## 2023-02-21 DIAGNOSIS — D649 Anemia, unspecified: Secondary | ICD-10-CM | POA: Diagnosis not present

## 2023-02-21 DIAGNOSIS — R110 Nausea: Secondary | ICD-10-CM | POA: Diagnosis not present

## 2023-02-21 DIAGNOSIS — Z5111 Encounter for antineoplastic chemotherapy: Secondary | ICD-10-CM | POA: Diagnosis not present

## 2023-02-21 DIAGNOSIS — E1165 Type 2 diabetes mellitus with hyperglycemia: Secondary | ICD-10-CM | POA: Diagnosis not present

## 2023-02-21 DIAGNOSIS — C259 Malignant neoplasm of pancreas, unspecified: Secondary | ICD-10-CM | POA: Diagnosis not present

## 2023-02-21 DIAGNOSIS — Z79899 Other long term (current) drug therapy: Secondary | ICD-10-CM | POA: Diagnosis not present

## 2023-02-21 DIAGNOSIS — K8689 Other specified diseases of pancreas: Secondary | ICD-10-CM | POA: Diagnosis not present

## 2023-02-21 DIAGNOSIS — C252 Malignant neoplasm of tail of pancreas: Secondary | ICD-10-CM | POA: Diagnosis not present

## 2023-02-21 DIAGNOSIS — Z7984 Long term (current) use of oral hypoglycemic drugs: Secondary | ICD-10-CM | POA: Diagnosis not present

## 2023-02-21 DIAGNOSIS — R1310 Dysphagia, unspecified: Secondary | ICD-10-CM | POA: Diagnosis not present

## 2023-02-21 DIAGNOSIS — T451X5A Adverse effect of antineoplastic and immunosuppressive drugs, initial encounter: Secondary | ICD-10-CM | POA: Diagnosis not present

## 2023-02-21 DIAGNOSIS — N879 Dysplasia of cervix uteri, unspecified: Secondary | ICD-10-CM | POA: Diagnosis not present

## 2023-02-21 DIAGNOSIS — R0609 Other forms of dyspnea: Secondary | ICD-10-CM | POA: Diagnosis not present

## 2023-03-07 DIAGNOSIS — G893 Neoplasm related pain (acute) (chronic): Secondary | ICD-10-CM | POA: Diagnosis not present

## 2023-03-07 DIAGNOSIS — D649 Anemia, unspecified: Secondary | ICD-10-CM | POA: Diagnosis not present

## 2023-03-07 DIAGNOSIS — E1165 Type 2 diabetes mellitus with hyperglycemia: Secondary | ICD-10-CM | POA: Diagnosis not present

## 2023-03-07 DIAGNOSIS — T451X5A Adverse effect of antineoplastic and immunosuppressive drugs, initial encounter: Secondary | ICD-10-CM | POA: Diagnosis not present

## 2023-03-07 DIAGNOSIS — C252 Malignant neoplasm of tail of pancreas: Secondary | ICD-10-CM | POA: Diagnosis not present

## 2023-03-07 DIAGNOSIS — R1310 Dysphagia, unspecified: Secondary | ICD-10-CM | POA: Diagnosis not present

## 2023-03-07 DIAGNOSIS — R0609 Other forms of dyspnea: Secondary | ICD-10-CM | POA: Diagnosis not present

## 2023-03-07 DIAGNOSIS — C259 Malignant neoplasm of pancreas, unspecified: Secondary | ICD-10-CM | POA: Diagnosis not present

## 2023-03-07 DIAGNOSIS — Z79899 Other long term (current) drug therapy: Secondary | ICD-10-CM | POA: Diagnosis not present

## 2023-03-07 DIAGNOSIS — R110 Nausea: Secondary | ICD-10-CM | POA: Diagnosis not present

## 2023-03-07 DIAGNOSIS — G4700 Insomnia, unspecified: Secondary | ICD-10-CM | POA: Diagnosis not present

## 2023-03-07 DIAGNOSIS — Z5111 Encounter for antineoplastic chemotherapy: Secondary | ICD-10-CM | POA: Diagnosis not present

## 2023-03-14 ENCOUNTER — Emergency Department: Payer: Medicaid Other

## 2023-03-14 ENCOUNTER — Inpatient Hospital Stay
Admission: EM | Admit: 2023-03-14 | Discharge: 2023-03-19 | DRG: 637 | Disposition: A | Discharge status: Home / Self Care | Payer: Medicaid Other | Attending: Internal Medicine | Admitting: Internal Medicine

## 2023-03-14 ENCOUNTER — Other Ambulatory Visit: Payer: Self-pay

## 2023-03-14 ENCOUNTER — Encounter: Payer: Self-pay | Admitting: *Deleted

## 2023-03-14 DIAGNOSIS — C252 Malignant neoplasm of tail of pancreas: Secondary | ICD-10-CM | POA: Diagnosis present

## 2023-03-14 DIAGNOSIS — Z880 Allergy status to penicillin: Secondary | ICD-10-CM

## 2023-03-14 DIAGNOSIS — B3731 Acute candidiasis of vulva and vagina: Secondary | ICD-10-CM | POA: Diagnosis not present

## 2023-03-14 DIAGNOSIS — Y929 Unspecified place or not applicable: Secondary | ICD-10-CM | POA: Diagnosis not present

## 2023-03-14 DIAGNOSIS — E876 Hypokalemia: Secondary | ICD-10-CM | POA: Diagnosis not present

## 2023-03-14 DIAGNOSIS — E1110 Type 2 diabetes mellitus with ketoacidosis without coma: Secondary | ICD-10-CM

## 2023-03-14 DIAGNOSIS — R4182 Altered mental status, unspecified: Secondary | ICD-10-CM

## 2023-03-14 DIAGNOSIS — Z881 Allergy status to other antibiotic agents status: Secondary | ICD-10-CM | POA: Diagnosis not present

## 2023-03-14 DIAGNOSIS — Z9081 Acquired absence of spleen: Secondary | ICD-10-CM | POA: Diagnosis not present

## 2023-03-14 DIAGNOSIS — D6481 Anemia due to antineoplastic chemotherapy: Secondary | ICD-10-CM | POA: Diagnosis present

## 2023-03-14 DIAGNOSIS — Z803 Family history of malignant neoplasm of breast: Secondary | ICD-10-CM | POA: Diagnosis not present

## 2023-03-14 DIAGNOSIS — N179 Acute kidney failure, unspecified: Secondary | ICD-10-CM | POA: Diagnosis present

## 2023-03-14 DIAGNOSIS — G9341 Metabolic encephalopathy: Secondary | ICD-10-CM | POA: Diagnosis not present

## 2023-03-14 DIAGNOSIS — R339 Retention of urine, unspecified: Secondary | ICD-10-CM | POA: Diagnosis not present

## 2023-03-14 DIAGNOSIS — Z888 Allergy status to other drugs, medicaments and biological substances status: Secondary | ICD-10-CM

## 2023-03-14 DIAGNOSIS — Z6836 Body mass index (BMI) 36.0-36.9, adult: Secondary | ICD-10-CM

## 2023-03-14 DIAGNOSIS — G893 Neoplasm related pain (acute) (chronic): Secondary | ICD-10-CM | POA: Diagnosis present

## 2023-03-14 DIAGNOSIS — E870 Hyperosmolality and hypernatremia: Secondary | ICD-10-CM | POA: Diagnosis not present

## 2023-03-14 DIAGNOSIS — E872 Acidosis, unspecified: Secondary | ICD-10-CM

## 2023-03-14 DIAGNOSIS — R680 Hypothermia, not associated with low environmental temperature: Secondary | ICD-10-CM | POA: Diagnosis not present

## 2023-03-14 DIAGNOSIS — C786 Secondary malignant neoplasm of retroperitoneum and peritoneum: Secondary | ICD-10-CM | POA: Diagnosis present

## 2023-03-14 DIAGNOSIS — Z91040 Latex allergy status: Secondary | ICD-10-CM | POA: Diagnosis not present

## 2023-03-14 DIAGNOSIS — G319 Degenerative disease of nervous system, unspecified: Secondary | ICD-10-CM | POA: Diagnosis not present

## 2023-03-14 DIAGNOSIS — N1832 Chronic kidney disease, stage 3b: Secondary | ICD-10-CM

## 2023-03-14 DIAGNOSIS — T451X5A Adverse effect of antineoplastic and immunosuppressive drugs, initial encounter: Secondary | ICD-10-CM | POA: Diagnosis present

## 2023-03-14 DIAGNOSIS — R109 Unspecified abdominal pain: Secondary | ICD-10-CM | POA: Diagnosis not present

## 2023-03-14 DIAGNOSIS — I700 Atherosclerosis of aorta: Secondary | ICD-10-CM | POA: Diagnosis not present

## 2023-03-14 DIAGNOSIS — E1111 Type 2 diabetes mellitus with ketoacidosis with coma: Principal | ICD-10-CM | POA: Diagnosis present

## 2023-03-14 DIAGNOSIS — I10 Essential (primary) hypertension: Secondary | ICD-10-CM | POA: Diagnosis present

## 2023-03-14 DIAGNOSIS — R9431 Abnormal electrocardiogram [ECG] [EKG]: Secondary | ICD-10-CM | POA: Diagnosis not present

## 2023-03-14 DIAGNOSIS — A419 Sepsis, unspecified organism: Secondary | ICD-10-CM | POA: Diagnosis not present

## 2023-03-14 DIAGNOSIS — C259 Malignant neoplasm of pancreas, unspecified: Secondary | ICD-10-CM | POA: Diagnosis not present

## 2023-03-14 LAB — URINALYSIS, ROUTINE W REFLEX MICROSCOPIC
Bacteria, UA: NONE SEEN
Bilirubin Urine: NEGATIVE
Glucose, UA: >=500 mg/dL — AB
Hgb urine dipstick: NEGATIVE
Ketones, ur: 5 mg/dL — AB
Leukocytes,Ua: NEGATIVE
Nitrite: NEGATIVE
Protein, ur: NEGATIVE mg/dL
Specific Gravity, Urine: 1.028 (ref 1.005–1.030)
Squamous Epithelial / HPF: NONE SEEN /HPF (ref 0–5)
pH: 5 (ref 5.0–8.0)

## 2023-03-14 LAB — CBC WITH DIFFERENTIAL/PLATELET
Abs Immature Granulocytes: 0.04 10*3/uL (ref 0.00–0.07)
Basophils Absolute: 0 10*3/uL (ref 0.0–0.1)
Basophils Relative: 1 %
Eosinophils Absolute: 0 10*3/uL (ref 0.0–0.5)
Eosinophils Relative: 0 %
HCT: 47.8 % — ABNORMAL HIGH (ref 36.0–46.0)
Hemoglobin: 14.2 g/dL (ref 12.0–15.0)
Immature Granulocytes: 1 %
Lymphocytes Relative: 17 %
Lymphs Abs: 1.4 10*3/uL (ref 0.7–4.0)
MCH: 29 pg (ref 26.0–34.0)
MCHC: 29.7 g/dL — ABNORMAL LOW (ref 30.0–36.0)
MCV: 97.6 fL (ref 80.0–100.0)
Monocytes Absolute: 0.6 10*3/uL (ref 0.1–1.0)
Monocytes Relative: 7 %
Neutro Abs: 6.5 10*3/uL (ref 1.7–7.7)
Neutrophils Relative %: 74 %
Platelets: 426 10*3/uL — ABNORMAL HIGH (ref 150–400)
RBC: 4.9 MIL/uL (ref 3.87–5.11)
RDW: 15.5 % (ref 11.5–15.5)
WBC: 8.7 10*3/uL (ref 4.0–10.5)
nRBC: 0 % (ref 0.0–0.2)

## 2023-03-14 LAB — BLOOD GAS, VENOUS
Acid-base deficit: 10.8 mmol/L — ABNORMAL HIGH (ref 0.0–2.0)
Bicarbonate: 17.2 mmol/L — ABNORMAL LOW (ref 20.0–28.0)
O2 Saturation: 52.2 %
Patient temperature: 37
pCO2, Ven: 45 mmHg (ref 44–60)
pH, Ven: 7.19 — CL (ref 7.25–7.43)
pO2, Ven: 32 mmHg (ref 32–45)

## 2023-03-14 LAB — COMPREHENSIVE METABOLIC PANEL
ALT: 14 U/L (ref 0–44)
AST: 17 U/L (ref 15–41)
Albumin: 4.2 g/dL (ref 3.5–5.0)
Alkaline Phosphatase: 148 U/L — ABNORMAL HIGH (ref 38–126)
Anion gap: 26 — ABNORMAL HIGH (ref 5–15)
BUN: 49 mg/dL — ABNORMAL HIGH (ref 6–20)
CO2: 15 mmol/L — ABNORMAL LOW (ref 22–32)
Calcium: 9.8 mg/dL (ref 8.9–10.3)
Chloride: 87 mmol/L — ABNORMAL LOW (ref 98–111)
Creatinine, Ser: 2.38 mg/dL — ABNORMAL HIGH (ref 0.44–1.00)
GFR, Estimated: 23 mL/min — ABNORMAL LOW (ref >60)
Glucose, Bld: >1200 mg/dL (ref 70–99)
Potassium: 4.4 mmol/L (ref 3.5–5.1)
Sodium: 128 mmol/L — ABNORMAL LOW (ref 135–145)
Total Bilirubin: 1.7 mg/dL — ABNORMAL HIGH (ref 0.3–1.2)
Total Protein: 8.4 g/dL — ABNORMAL HIGH (ref 6.5–8.1)

## 2023-03-14 LAB — LACTIC ACID, PLASMA
Lactic Acid, Venous: 4.2 mmol/L (ref 0.5–1.9)
Lactic Acid, Venous: 3.4 mmol/L (ref 0.5–1.9)

## 2023-03-14 LAB — CBG MONITORING, ED
Glucose-Capillary: >600 mg/dL (ref 70–99)
Glucose-Capillary: >600 mg/dL (ref 70–99)
Glucose-Capillary: >600 mg/dL (ref 70–99)

## 2023-03-14 LAB — PROTIME-INR
INR: 1.2 (ref 0.8–1.2)
Prothrombin Time: 15.4 seconds — ABNORMAL HIGH (ref 11.4–15.2)

## 2023-03-14 LAB — APTT: aPTT: 22 seconds — ABNORMAL LOW (ref 24–36)

## 2023-03-14 MED ORDER — SODIUM CHLORIDE 0.9 % IV SOLN: Freq: Once | INTRAVENOUS | Status: AC

## 2023-03-14 MED ORDER — INSULIN REGULAR(HUMAN) IN NACL 100-0.9 UT/100ML-% IV SOLN
INTRAVENOUS | Status: DC
  Administered 2023-03-14: 14 [IU]/h via INTRAVENOUS
  Filled 2023-03-14 (×2): qty 100

## 2023-03-14 MED ORDER — LACTATED RINGERS IV BOLUS
20.0000 mL/kg | Freq: Once | INTRAVENOUS | Status: AC
  Administered 2023-03-15: 1920 mL via INTRAVENOUS

## 2023-03-14 MED ORDER — LORAZEPAM 2 MG/ML IJ SOLN
0.5000 mg | Freq: Once | INTRAMUSCULAR | Status: AC
  Administered 2023-03-14: 0.5 mg via INTRAVENOUS
  Filled 2023-03-14: qty 1

## 2023-03-14 MED ORDER — DEXTROSE IN LACTATED RINGERS 5 % IV SOLN: INTRAVENOUS | Status: DC

## 2023-03-14 MED ORDER — LACTATED RINGERS IV SOLN: INTRAVENOUS | Status: DC

## 2023-03-14 MED ORDER — SODIUM CHLORIDE 0.9 % IV SOLN
2.0000 g | Freq: Once | INTRAVENOUS | Status: AC
  Administered 2023-03-14: 2 g via INTRAVENOUS
  Filled 2023-03-14: qty 12.5

## 2023-03-14 MED ORDER — POTASSIUM CHLORIDE 10 MEQ/100ML IV SOLN
10.0000 meq | INTRAVENOUS | Status: AC
  Administered 2023-03-14 (×2): 10 meq via INTRAVENOUS
  Filled 2023-03-14 (×2): qty 100

## 2023-03-14 MED ORDER — DEXTROSE 50 % IV SOLN: 0.0000 mL | INTRAVENOUS | Status: DC | PRN

## 2023-03-14 MED ORDER — ENOXAPARIN SODIUM 40 MG/0.4ML IJ SOSY
40.0000 mg | PREFILLED_SYRINGE | INTRAMUSCULAR | Status: DC
  Administered 2023-03-15 – 2023-03-19 (×5): 40 mg via SUBCUTANEOUS
  Filled 2023-03-14 (×5): qty 0.4

## 2023-03-14 MED ORDER — VANCOMYCIN HCL IN DEXTROSE 1-5 GM/200ML-% IV SOLN
1000.0000 mg | Freq: Once | INTRAVENOUS | Status: AC
  Administered 2023-03-14: 1000 mg via INTRAVENOUS
  Filled 2023-03-14: qty 200

## 2023-03-14 MED ORDER — SODIUM CHLORIDE 0.9 % IV BOLUS
500.0000 mL | Freq: Once | INTRAVENOUS | Status: AC
  Administered 2023-03-14: 500 mL via INTRAVENOUS

## 2023-03-14 MED ORDER — METRONIDAZOLE 500 MG/100ML IV SOLN
500.0000 mg | Freq: Once | INTRAVENOUS | Status: AC
  Administered 2023-03-14: 500 mg via INTRAVENOUS
  Filled 2023-03-14: qty 100

## 2023-03-14 NOTE — Progress Notes (Signed)
CODE SEPSIS - PHARMACY COMMUNICATION  **Broad Spectrum Antibiotics should be administered within 1 hour of Sepsis diagnosis**  Time Code Sepsis Called/Page Received: 2103  Antibiotics Ordered: cefepime and vancomycin  Time of 1st antibiotic administration: 2127  Additional action taken by pharmacy: non  If necessary, Name of Provider/Nurse Contacted: n/a    Elliot Gurney, PharmD, BCPS Clinical Pharmacist  03/14/2023 8:58 PM

## 2023-03-14 NOTE — Assessment & Plan Note (Addendum)
Suspect secondary to diabetic ketoacidosis Sepsis not suspected at this time. Patient was treated with IV antibiotics in the ED Will get procalcitonin Chest x-ray clear, urinalysis sterile.

## 2023-03-14 NOTE — H&P (Signed)
History and Physical    PatientAeralyn Barry ZOX:[id number] DOB: 08/05/1961 DOA: 03/14/2023 DOS: the patient was seen and examined on 03/14/2023 PCP: Cathie Hoops, PA  Patient coming from: Home  Chief Complaint:  Chief Complaint  Patient presents with   Altered Mental Status    HPI: Regina Barry is a 62 y.o. 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  Patient is unable to contribute to the history.  Patient was apparently in her usual state of health 2 days prior. ED course and data review: Vitals within normal limits except for mild hypothermia of 97.5. Labs consistent with diabetic ketoacidosis with glucose over 1200, anion gap of 26, venous pH 7.19.  Bicarb 15 and sodium 128.Creatinine 2.38, up from baseline of 0.9 at [facility] on 5/21.  WBC normal at 8.7 but with lactic acid 4.2-3.4. EKG, personally viewed and interpreted with NSR at 76 Chest x-ray nonacute CT head without acute intracranial abnormality. Patient was initially started with fluid boluses and antibiotics for suspected severe sepsis however as it became clear at that it was DKA she was subsequently started on an insulin infusion. ICU was consulted, however due to pH above 7 they advised patient would be appropriate for stepdown. Hospitalist subsequently consulted.    Past Medical History:  Diagnosis Date   Diabetes mellitus without complication (HCC)    Hypertension    History reviewed. No pertinent surgical history. Social History:  reports that she has never smoked. She has never used smokeless tobacco. She reports that she does not currently use alcohol. No history on file for drug use.  Allergies  Allergen Reactions   Diflucortolone    Doxycycline     Latex    Penicillins     Family History  Problem Relation Age of Onset   Breast cancer Mother 22   Breast cancer Paternal Aunt    Breast cancer Cousin     Prior to Admission medications   Medication Sig Start Date End Date Taking? Authorizing Provider  HYDROcodone bit-homatropine (HYCODAN) 5-1.5 MG/5ML syrup Take 5 mLs by mouth every 6 (six) hours as needed for cough (severe cough). 02/24/21   Shaune Pollack, MD    Physical Exam: Vitals:   03/14/23 2130 03/14/23 2200 03/14/23 2230 03/14/23 2300  BP: (!) 142/86 136/79 (!) 146/78 (!) 146/69  Pulse: 76 71 73 73  Resp: (!) 23 (!) 24 (!) 22 (!) 23  Temp:      TempSrc:      SpO2: 98% 98% 100% 100%  Weight:      Height:       Physical Exam Vitals and nursing note reviewed.  Constitutional:      General: She is not in acute distress.    Comments: Patient very lethargic will respond with a grunt when shaken.    HENT:     Head: Normocephalic and atraumatic.  Cardiovascular:     Rate and Rhythm: Normal rate and regular rhythm.     Heart sounds: Normal heart sounds.  Pulmonary:     Effort: Tachypnea present.     Breath sounds: Normal breath sounds.  Abdominal:     Palpations: Abdomen is soft.     Tenderness: There is no abdominal tenderness.  Neurological:     Mental Status: She is lethargic.  Labs on Admission: I have personally reviewed following labs and imaging studies  CBC: Recent Labs  Lab 03/14/23 2017  WBC 8.7  NEUTROABS 6.5  HGB 14.2  HCT 47.8*  MCV 97.6  PLT 426*   Basic Metabolic Panel: Recent Labs  Lab 03/14/23 2017  NA 128*  K 4.4  CL 87*  CO2 15*  GLUCOSE >1,200*  BUN 49*  CREATININE 2.38*  CALCIUM 9.8   GFR: Estimated Creatinine Clearance: 28.6 mL/min (A) (by C-G formula based on SCr of 2.38 mg/dL (H)). Liver Function Tests: Recent Labs  Lab 03/14/23 2017  AST 17  ALT 14  ALKPHOS 148*  BILITOT 1.7*  PROT 8.4*  ALBUMIN 4.2   No results for input(s): "LIPASE", "AMYLASE" in  the last 168 hours. No results for input(s): "AMMONIA" in the last 168 hours. Coagulation Profile: Recent Labs  Lab 03/14/23 2017  INR 1.2   Cardiac Enzymes: No results for input(s): "CKTOTAL", "CKMB", "CKMBINDEX", "TROPONINI" in the last 168 hours. BNP (last 3 results) No results for input(s): "PROBNP" in the last 8760 hours. HbA1C: No results for input(s): "HGBA1C" in the last 72 hours. CBG: Recent Labs  Lab 03/14/23 2157 03/14/23 2239  GLUCAP >600* >600*   Lipid Profile: No results for input(s): "CHOL", "HDL", "LDLCALC", "TRIG", "CHOLHDL", "LDLDIRECT" in the last 72 hours. Thyroid Function Tests: No results for input(s): "TSH", "T4TOTAL", "FREET4", "T3FREE", "THYROIDAB" in the last 72 hours. Anemia Panel: No results for input(s): "VITAMINB12", "FOLATE", "FERRITIN", "TIBC", "IRON", "RETICCTPCT" in the last 72 hours. Urine analysis:    Component Value Date/Time   COLORURINE STRAW (A) 03/14/2023 2122   APPEARANCEUR CLEAR (A) 03/14/2023 2122   APPEARANCEUR Clear 06/09/2012 1650   LABSPEC 1.028 03/14/2023 2122   LABSPEC 1.005 06/09/2012 1650   PHURINE 5.0 03/14/2023 2122   GLUCOSEU >=500 (A) 03/14/2023 2122   GLUCOSEU Negative 06/09/2012 1650   HGBUR NEGATIVE 03/14/2023 2122   BILIRUBINUR NEGATIVE 03/14/2023 2122   BILIRUBINUR Negative 06/09/2012 1650   KETONESUR 5 (A) 03/14/2023 2122   PROTEINUR NEGATIVE 03/14/2023 2122   NITRITE NEGATIVE 03/14/2023 2122   LEUKOCYTESUR NEGATIVE 03/14/2023 2122   LEUKOCYTESUR Negative 06/09/2012 1650    Radiological Exams on Admission: DG Chest Port 1 View  Result Date: 03/14/2023 CLINICAL DATA:  Sepsis EXAM: PORTABLE CHEST 1 VIEW COMPARISON:  02/24/2021 FINDINGS: Lung volumes are small. No pneumothorax or pleural effusion. Right internal jugular chest port tip is seen within the superior vena cava. Cardiac size within normal limits. Pulmonary vascularity is normal. No acute bone abnormality. IMPRESSION: 1. Pulmonary hypoinflation.  Electronically Signed   By: Helyn Numbers M.D.   On: 03/14/2023 21:47   CT Head Wo Contrast  Result Date: 03/14/2023 CLINICAL DATA:  Altered mental status. EXAM: CT HEAD WITHOUT CONTRAST TECHNIQUE: Contiguous axial images were obtained from the base of the skull through the vertex without intravenous contrast. RADIATION DOSE REDUCTION: This exam was performed according to the departmental dose-optimization program which includes automated exposure control, adjustment of the mA and/or kV according to patient size and/or use of iterative reconstruction technique. COMPARISON:  None Available. FINDINGS: Brain: There is mild cerebral atrophy with widening of the extra-axial spaces and ventricular dilatation. There are areas of decreased attenuation within the white matter tracts of the supratentorial brain, consistent with microvascular disease changes. Vascular: No hyperdense vessel or unexpected calcification. Skull: Normal. Negative for fracture or focal lesion. Sinuses/Orbits: No acute finding. Other: None. IMPRESSION: 1. No acute intracranial abnormality. Electronically Signed   By: Waylan Rocher  Houston M.D.   On: 03/14/2023 20:34     Data Reviewed: Relevant notes from primary care and specialist visits, past discharge summaries as available in EHR, including Care Everywhere. Prior diagnostic testing as pertinent to current admission diagnoses Updated medications and problem lists for reconciliation ED course, including vitals, labs, imaging, treatment and response to treatment Triage notes, nursing and pharmacy notes and ED provider's notes Notable results as noted in HPI   Assessment and Plan: * Diabetic ketoacidosis with coma associated with type 2 diabetes mellitus (HCC) Patient arousable to shaking but readily falls back asleep, very confused and encephalopathic CT head negative IV fluid bolus insulin infusion, potassium replacement per Endo tool N.p.o. with aspiration precautions Neurologic  checks To transition to subcu when gap is closed and not acidotic  AKI (acute kidney injury) (HCC) Creatinine 2.38 up from baseline of 0.91 on 5/21 Secondary to DKA Expecting improvement to baseline with treatment of DKA Continue to monitor and avoid nephrotoxins.  Lactic acidosis Suspect secondary to diabetic ketoacidosis Sepsis not suspected at this time. Patient was treated with IV antibiotics in the ED Will get procalcitonin Chest x-ray clear, urinalysis sterile.  Obesity, Class III, BMI 40-49.9 (morbid obesity) (HCC) Patient with BMI of 36 with comorbid factors of diabetes and hypertension Complicating factor to overall prognosis and care  Essential hypertension Normotensive.  Will hold home antihypertensives for now  Acinar cell carcinoma of pancreas (HCC) Followed at [facility].  Last chemoinfusion was 6/4      DVT prophylaxis: Lovenox  Consults: none  Advance Care Planning: full code  Family Communication: none  Disposition Plan: Back to previous home environment  Severity of Illness: The appropriate patient status for this patient is INPATIENT. Inpatient status is judged to be reasonable and necessary in order to provide the required intensity of service to ensure the patient's safety. The patient's presenting symptoms, physical exam findings, and initial radiographic and laboratory data in the context of their chronic comorbidities is felt to place them at high risk for further clinical deterioration. Furthermore, it is not anticipated that the patient will be medically stable for discharge from the hospital within 2 midnights of admission.   * I certify that at the point of admission it is my clinical judgment that the patient will require inpatient hospital care spanning beyond 2 midnights from the point of admission due to high intensity of service, high risk for further deterioration and high frequency of surveillance required.*  Author: Andris Baumann,  MD 03/14/2023 11:37 PM  For on call review [email or web address].

## 2023-03-14 NOTE — ED Notes (Signed)
Intensivist verbally notified of pt VBG

## 2023-03-14 NOTE — ED Notes (Addendum)
Spoke with Dr Larinda Buttery regarding pt's hx and cc; order obtained for head CT; charge nurse aware exam room needed for further evaluation

## 2023-03-14 NOTE — Assessment & Plan Note (Signed)
Patient with BMI of 36 with comorbid factors of diabetes and hypertension Complicating factor to overall prognosis and care

## 2023-03-14 NOTE — Assessment & Plan Note (Signed)
Followed at [facility].  Last chemoinfusion was 6/4

## 2023-03-14 NOTE — Assessment & Plan Note (Addendum)
Patient arousable to shaking but readily falls back asleep, very confused and encephalopathic CT head negative IV fluid bolus insulin infusion, potassium replacement per Endo tool N.p.o. with aspiration precautions Neurologic checks To transition to subcu when gap is closed and not acidotic

## 2023-03-14 NOTE — Progress Notes (Signed)
PHARMACY -  BRIEF ANTIBIOTIC NOTE   Pharmacy has received consult(s) for cefepime and vancomycin from an ED provider.  The patient's profile has been reviewed for ht/wt/allergies/indication/available labs.    One time order(s) placed for cefepime 2 grams x 1 and vancomycin 1,000 mg x 1  Further antibiotics/pharmacy consults should be ordered by admitting physician if indicated.                       Thank you,  Elliot Gurney, PharmD, BCPS Clinical Pharmacist  03/14/2023 8:57 PM

## 2023-03-14 NOTE — Assessment & Plan Note (Signed)
Creatinine 2.38 up from baseline of 0.91 on 5/21 Secondary to DKA Expecting improvement to baseline with treatment of DKA Continue to monitor and avoid nephrotoxins.

## 2023-03-14 NOTE — ED Provider Notes (Signed)
[facility] Provider Note    Event Date/Time   First MD Initiated Contact with Patient 03/14/23 2010     (approximate)   History   Altered Mental Status   HPI  Regina Barry is a 62 y.o. female with a history of pancreatic cancer, diabetes, diabetes who presents with altered mental status.  Patient is unable to give significant history, review of records demonstrates the patient saw her oncologist on June 4, had infusion on the fourth as well.  Is scheduled for a GI procedure.  Family arrived and reports that over the last 1 to 2 days patient has become more altered and confused     Physical Exam   Triage Vital Signs: ED Triage Vitals  Enc Vitals Group     BP 03/14/23 1834 136/78     Pulse Rate 03/14/23 1834 75     Resp 03/14/23 1834 20     Temp 03/14/23 1834 (!) 97.5 F (36.4 C)     Temp Source 03/14/23 1834 Oral     SpO2 03/14/23 1834 96 %     Weight 03/14/23 1835 96 kg (211 lb 10.3 oz)     Height 03/14/23 1835 1.626 m (5\' 4" )     Head Circumference --      Peak Flow --      Pain Score 03/14/23 1834 0     Pain Loc --      Pain Edu? --      Excl. in GC? --     Most recent vital signs: Vitals:   03/14/23 2200 03/14/23 2230  BP: 136/79 (!) 146/78  Pulse: 71 73  Resp: (!) 24 (!) 22  Temp:    SpO2: 98% 100%     General: Awake, mumbles responses to questions that do not make sense CV:  Good peripheral perfusion.  Resp:  Normal effort.  Clear to auscultation bilaterally Abd:  No distention.  Soft, nontender Other:  Appears to move all extremities equally but difficult to assess   ED Results / Procedures / Treatments   Labs (all labs ordered are listed, but only abnormal results are displayed) Labs Reviewed  CBC WITH DIFFERENTIAL/PLATELET - Abnormal; Notable for the following components:      Result Value   HCT 47.8 (*)    MCHC 29.7 (*)    Platelets 426 (*)    All other components within normal limits  COMPREHENSIVE  METABOLIC PANEL - Abnormal; Notable for the following components:   Sodium 128 (*)    Chloride 87 (*)    CO2 15 (*)    Glucose, Bld >1,200 (*)    BUN 49 (*)    Creatinine, Ser 2.38 (*)    Total Protein 8.4 (*)    Alkaline Phosphatase 148 (*)    Total Bilirubin 1.7 (*)    GFR, Estimated 23 (*)    Anion gap 26 (*)    All other components within normal limits  LACTIC ACID, PLASMA - Abnormal; Notable for the following components:   Lactic Acid, Venous 4.2 (*)    All other components within normal limits  APTT - Abnormal; Notable for the following components:   aPTT 22 (*)    All other components within normal limits  PROTIME-INR - Abnormal; Notable for the following components:   Prothrombin Time 15.4 (*)    All other components within normal limits  URINALYSIS, ROUTINE W REFLEX MICROSCOPIC - Abnormal; Notable for the following components:   Color, Urine STRAW (*)  APPearance CLEAR (*)    Glucose, UA >=500 (*)    Ketones, ur 5 (*)    All other components within normal limits  BLOOD GAS, VENOUS - Abnormal; Notable for the following components:   pH, Ven 7.19 (*)    Bicarbonate 17.2 (*)    Acid-base deficit 10.8 (*)    All other components within normal limits  CBG MONITORING, ED - Abnormal; Notable for the following components:   Glucose-Capillary >600 (*)    All other components within normal limits  CBG MONITORING, ED - Abnormal; Notable for the following components:   Glucose-Capillary >600 (*)    All other components within normal limits  CULTURE, BLOOD (ROUTINE X 2)  CULTURE, BLOOD (ROUTINE X 2)  URINE CULTURE  LACTIC ACID, PLASMA  BETA-HYDROXYBUTYRIC ACID  BETA-HYDROXYBUTYRIC ACID     EKG  ED ECG REPORT I, Jene Every, the attending physician, personally viewed and interpreted this ECG.  Date: 03/14/2023  Rhythm: normal sinus rhythm QRS Axis: normal Intervals: normal ST/T Wave abnormalities: Nonspecific changes Narrative Interpretation: no evidence of  acute ischemia    RADIOLOGY CT head viewed interpret by me, no acute abnormality    PROCEDURES:  Critical Care performed: yes  CRITICAL CARE Performed by: Jene Every   Total critical care time: 30 minutes  Critical care time was exclusive of separately billable procedures and treating other patients.  Critical care was necessary to treat or prevent imminent or life-threatening deterioration.  Critical care was time spent personally by me on the following activities: development of treatment plan with patient and/or surrogate as well as nursing, discussions with consultants, evaluation of patient's response to treatment, examination of patient, obtaining history from patient or surrogate, ordering and performing treatments and interventions, ordering and review of laboratory studies, ordering and review of radiographic studies, pulse oximetry and re-evaluation of patient's condition.   Procedures   MEDICATIONS ORDERED IN ED: Medications  lactated ringers infusion (0 mLs Intravenous Paused 03/14/23 2133)  metroNIDAZOLE (FLAGYL) IVPB 500 mg (500 mg Intravenous New Bag/Given 03/14/23 2215)  vancomycin (VANCOCIN) IVPB 1000 mg/200 mL premix (has no administration in time range)  insulin regular, human (MYXREDLIN) 100 units/ 100 mL infusion (14 Units/hr Intravenous New Bag/Given 03/14/23 2213)  dextrose 5 % in lactated ringers infusion (has no administration in time range)  dextrose 50 % solution 0-50 mL (has no administration in time range)  potassium chloride 10 mEq in 100 mL IVPB (10 mEq Intravenous New Bag/Given 03/14/23 2214)  lactated ringers infusion (has no administration in time range)  sodium chloride 0.9 % bolus 500 mL (0 mLs Intravenous Stopped 03/14/23 2101)  LORazepam (ATIVAN) injection 0.5 mg (0.5 mg Intravenous Given 03/14/23 2059)  ceFEPIme (MAXIPIME) 2 g in sodium chloride 0.9 % 100 mL IVPB (0 g Intravenous Stopped 03/14/23 2205)  0.9 %  sodium chloride infusion (  Intravenous New Bag/Given 03/14/23 2133)  0.9 %  sodium chloride infusion ( Intravenous New Bag/Given 03/14/23 2132)     IMPRESSION / MDM / ASSESSMENT AND PLAN / ED COURSE  I reviewed the triage vital signs and the nursing notes. Patient's presentation is most consistent with acute presentation with potential threat to life or bodily function.  Patient with known pancreatic cancer, recent chemoinfusion presents with altered mental status.  Differential includes metastases, electrolyte abnormalities, sepsis  Afebrile initially however temperature is mildly low, heart rate is normal.  Concern for metastases/ICH, sent for stat CT  Lab work overall demonstrates normal white blood cell count however  lactic acid is significantly elevated at 4.2.  Given altered mental status, elevated lactic acid, initial hypothermia, code sepsis activated with broad-spectrum antibiotics, will give 30 mL/kg given elevated lactic over 4  ----------------------------------------- 9:45PM on 03/14/2023 ----------------------------------------- Notified of critically elevated glucose of 1200, elevated anion gap, CO2 less than 18 consistent with DKA, insulin drip ordered, pending VBG, beta hydroxybutyric acid  pH is 7.19  ----------------------------------------- 10:52 PM on 03/14/2023 ----------------------------------------- Appreciate consult from ICU, they feel patient is appropriate for stepdown, will consult hospitalist for admission     FINAL CLINICAL IMPRESSION(S) / ED DIAGNOSES   Final diagnoses:  Altered mental status, unspecified altered mental status type  Diabetic ketoacidosis without coma associated with type 2 diabetes mellitus (HCC)     Rx / DC Orders   ED Discharge Orders     None        Note:  This document was prepared using Dragon voice recognition software and may include unintentional dictation errors.   Jene Every, MD 03/14/23 2252

## 2023-03-14 NOTE — Progress Notes (Signed)
Asked by EDP to evaluate patient for ICU admission. Patient presented with main complaint of AMS. Upon arrival to ED patient encephalopathic but protecting her airway with initial labwork suggestive of HHNKS vs DKA +/- sepsis in the setting of pancreatic cancer. Vitals stable on room air.   Upon bedside assessment, patient is altered and mumbling but responsive to voice and protecting airway. Patient had also received 0.5 mg of Ativan for agitation earlier. Vitals remain stable on room air. VBG is reassuring: 7.19/45/32/17.2. Patient does not meet ICU admission criteria at this time.  Please re-consult if needed in the future.       Cheryll Cockayne Rust-Chester, AGACNP-BC Acute Care Nurse Practitioner [facility]    [phone] / [phone] Please see Amion for pager details.

## 2023-03-14 NOTE — Sepsis Progress Note (Signed)
Sepsis protocol is being followed by [facility]. 

## 2023-03-14 NOTE — Assessment & Plan Note (Signed)
Normotensive.  Will hold home antihypertensives for now

## 2023-03-14 NOTE — ED Triage Notes (Addendum)
Pt to triage via wheelchair.  Pt has pancreatic cancer and is treated at [facility].  Pt has AMS for 2 days.  Pt unable to answer questions in triage.  Pt unable to follow commands.   Pt has a port.  Last chemo was last week per family.

## 2023-03-15 ENCOUNTER — Inpatient Hospital Stay: Payer: Medicaid Other

## 2023-03-15 DIAGNOSIS — R4182 Altered mental status, unspecified: Secondary | ICD-10-CM | POA: Diagnosis not present

## 2023-03-15 DIAGNOSIS — R109 Unspecified abdominal pain: Secondary | ICD-10-CM | POA: Diagnosis not present

## 2023-03-15 DIAGNOSIS — E1111 Type 2 diabetes mellitus with ketoacidosis with coma: Secondary | ICD-10-CM | POA: Diagnosis not present

## 2023-03-15 DIAGNOSIS — G319 Degenerative disease of nervous system, unspecified: Secondary | ICD-10-CM | POA: Diagnosis not present

## 2023-03-15 LAB — BASIC METABOLIC PANEL
Anion gap: 11 (ref 5–15)
Anion gap: 10 (ref 5–15)
Anion gap: 10 (ref 5–15)
Anion gap: 9 (ref 5–15)
BUN: 35 mg/dL — ABNORMAL HIGH (ref 6–20)
BUN: 30 mg/dL — ABNORMAL HIGH (ref 6–20)
BUN: 30 mg/dL — ABNORMAL HIGH (ref 6–20)
BUN: 24 mg/dL — ABNORMAL HIGH (ref 6–20)
CO2: 21 mmol/L — ABNORMAL LOW (ref 22–32)
CO2: 23 mmol/L (ref 22–32)
CO2: 21 mmol/L — ABNORMAL LOW (ref 22–32)
CO2: 23 mmol/L (ref 22–32)
Calcium: 9 mg/dL (ref 8.9–10.3)
Calcium: 8.3 mg/dL — ABNORMAL LOW (ref 8.9–10.3)
Calcium: 9.1 mg/dL (ref 8.9–10.3)
Calcium: 7.9 mg/dL — ABNORMAL LOW (ref 8.9–10.3)
Chloride: 119 mmol/L — ABNORMAL HIGH (ref 98–111)
Chloride: 120 mmol/L — ABNORMAL HIGH (ref 98–111)
Chloride: 122 mmol/L — ABNORMAL HIGH (ref 98–111)
Chloride: 121 mmol/L — ABNORMAL HIGH (ref 98–111)
Creatinine, Ser: 1.35 mg/dL — ABNORMAL HIGH (ref 0.44–1.00)
Creatinine, Ser: 1.19 mg/dL — ABNORMAL HIGH (ref 0.44–1.00)
Creatinine, Ser: 1.26 mg/dL — ABNORMAL HIGH (ref 0.44–1.00)
Creatinine, Ser: 1.14 mg/dL — ABNORMAL HIGH (ref 0.44–1.00)
GFR, Estimated: 45 mL/min — ABNORMAL LOW (ref >60)
GFR, Estimated: 53 mL/min — ABNORMAL LOW (ref >60)
GFR, Estimated: 49 mL/min — ABNORMAL LOW (ref >60)
GFR, Estimated: 55 mL/min — ABNORMAL LOW (ref >60)
Glucose, Bld: 387 mg/dL — ABNORMAL HIGH (ref 70–99)
Glucose, Bld: 174 mg/dL — ABNORMAL HIGH (ref 70–99)
Glucose, Bld: 183 mg/dL — ABNORMAL HIGH (ref 70–99)
Glucose, Bld: 172 mg/dL — ABNORMAL HIGH (ref 70–99)
Potassium: 3 mmol/L — ABNORMAL LOW (ref 3.5–5.1)
Potassium: 3.4 mmol/L — ABNORMAL LOW (ref 3.5–5.1)
Potassium: 3.6 mmol/L (ref 3.5–5.1)
Potassium: 3.1 mmol/L — ABNORMAL LOW (ref 3.5–5.1)
Sodium: 151 mmol/L — ABNORMAL HIGH (ref 135–145)
Sodium: 153 mmol/L — ABNORMAL HIGH (ref 135–145)
Sodium: 153 mmol/L — ABNORMAL HIGH (ref 135–145)
Sodium: 153 mmol/L — ABNORMAL HIGH (ref 135–145)

## 2023-03-15 LAB — URINE DRUG SCREEN, QUALITATIVE (ARMC ONLY)
Amphetamines, Ur Screen: NOT DETECTED
Barbiturates, Ur Screen: NOT DETECTED
Benzodiazepine, Ur Scrn: NOT DETECTED
Cannabinoid 50 Ng, Ur ~~LOC~~: NOT DETECTED
Cocaine Metabolite,Ur ~~LOC~~: NOT DETECTED
MDMA (Ecstasy)Ur Screen: NOT DETECTED
Methadone Scn, Ur: NOT DETECTED
Opiate, Ur Screen: NOT DETECTED
Phencyclidine (PCP) Ur S: NOT DETECTED
Tricyclic, Ur Screen: NOT DETECTED

## 2023-03-15 LAB — CBG MONITORING, ED
Glucose-Capillary: >600 mg/dL (ref 70–99)
Glucose-Capillary: >600 mg/dL (ref 70–99)

## 2023-03-15 LAB — BETA-HYDROXYBUTYRIC ACID
Beta-Hydroxybutyric Acid: 6.23 mmol/L — ABNORMAL HIGH (ref 0.05–0.27)
Beta-Hydroxybutyric Acid: 0.68 mmol/L — ABNORMAL HIGH (ref 0.05–0.27)
Beta-Hydroxybutyric Acid: 0.28 mmol/L — ABNORMAL HIGH (ref 0.05–0.27)

## 2023-03-15 LAB — GLUCOSE, CAPILLARY
Glucose-Capillary: 412 mg/dL — ABNORMAL HIGH (ref 70–99)
Glucose-Capillary: 481 mg/dL — ABNORMAL HIGH (ref 70–99)
Glucose-Capillary: 395 mg/dL — ABNORMAL HIGH (ref 70–99)
Glucose-Capillary: 336 mg/dL — ABNORMAL HIGH (ref 70–99)
Glucose-Capillary: 274 mg/dL — ABNORMAL HIGH (ref 70–99)
Glucose-Capillary: 202 mg/dL — ABNORMAL HIGH (ref 70–99)
Glucose-Capillary: 182 mg/dL — ABNORMAL HIGH (ref 70–99)
Glucose-Capillary: 197 mg/dL — ABNORMAL HIGH (ref 70–99)
Glucose-Capillary: 224 mg/dL — ABNORMAL HIGH (ref 70–99)
Glucose-Capillary: 174 mg/dL — ABNORMAL HIGH (ref 70–99)
Glucose-Capillary: 141 mg/dL — ABNORMAL HIGH (ref 70–99)
Glucose-Capillary: 177 mg/dL — ABNORMAL HIGH (ref 70–99)
Glucose-Capillary: 199 mg/dL — ABNORMAL HIGH (ref 70–99)
Glucose-Capillary: 161 mg/dL — ABNORMAL HIGH (ref 70–99)
Glucose-Capillary: 186 mg/dL — ABNORMAL HIGH (ref 70–99)
Glucose-Capillary: 180 mg/dL — ABNORMAL HIGH (ref 70–99)
Glucose-Capillary: 283 mg/dL — ABNORMAL HIGH (ref 70–99)

## 2023-03-15 LAB — LIPASE, BLOOD: Lipase: 331 U/L — ABNORMAL HIGH (ref 11–51)

## 2023-03-15 LAB — MRSA NEXT GEN BY PCR, NASAL: MRSA by PCR Next Gen: NOT DETECTED

## 2023-03-15 LAB — HIV ANTIBODY (ROUTINE TESTING W REFLEX): HIV Screen 4th Generation wRfx: NONREACTIVE

## 2023-03-15 LAB — CULTURE, BLOOD (ROUTINE X 2): Special Requests: ADEQUATE

## 2023-03-15 MED ORDER — LORAZEPAM 2 MG/ML IJ SOLN
1.0000 mg | Freq: Once | INTRAMUSCULAR | Status: AC
  Administered 2023-03-15: 1 mg via INTRAVENOUS

## 2023-03-15 MED ORDER — FLUCONAZOLE IN SODIUM CHLORIDE 200-0.9 MG/100ML-% IV SOLN
200.0000 mg | INTRAVENOUS | Status: AC
  Administered 2023-03-15 – 2023-03-17 (×3): 200 mg via INTRAVENOUS
  Filled 2023-03-15 (×4): qty 100

## 2023-03-15 MED ORDER — POTASSIUM CHLORIDE 10 MEQ/100ML IV SOLN
10.0000 meq | INTRAVENOUS | Status: AC
  Administered 2023-03-15 (×5): 10 meq via INTRAVENOUS
  Filled 2023-03-15 (×6): qty 100

## 2023-03-15 MED ORDER — INSULIN ASPART 100 UNIT/ML IJ SOLN
0.0000 [IU] | INTRAMUSCULAR | Status: DC
  Administered 2023-03-15: 8 [IU] via SUBCUTANEOUS
  Administered 2023-03-16: 3 [IU] via SUBCUTANEOUS
  Administered 2023-03-16 (×3): 5 [IU] via SUBCUTANEOUS
  Administered 2023-03-16 – 2023-03-17 (×3): 8 [IU] via SUBCUTANEOUS
  Administered 2023-03-17: 5 [IU] via SUBCUTANEOUS
  Administered 2023-03-17: 3 [IU] via SUBCUTANEOUS
  Administered 2023-03-17: 8 [IU] via SUBCUTANEOUS
  Administered 2023-03-17: 5 [IU] via SUBCUTANEOUS
  Administered 2023-03-18 (×2): 3 [IU] via SUBCUTANEOUS
  Filled 2023-03-15 (×13): qty 1

## 2023-03-15 MED ORDER — POTASSIUM CHLORIDE 10 MEQ/100ML IV SOLN
10.0000 meq | INTRAVENOUS | Status: AC
  Administered 2023-03-15 (×4): 10 meq via INTRAVENOUS
  Filled 2023-03-15 (×4): qty 100

## 2023-03-15 MED ORDER — CHLORHEXIDINE GLUCONATE CLOTH 2 % EX PADS
6.0000 | MEDICATED_PAD | Freq: Every day | CUTANEOUS | Status: DC
  Administered 2023-03-15 – 2023-03-19 (×5): 6 via TOPICAL

## 2023-03-15 MED ORDER — HYDROMORPHONE HCL 1 MG/ML IJ SOLN
1.0000 mg | INTRAMUSCULAR | Status: DC | PRN
  Administered 2023-03-15 – 2023-03-16 (×5): 1 mg via INTRAVENOUS
  Filled 2023-03-15 (×5): qty 1

## 2023-03-15 MED ORDER — MORPHINE SULFATE (PF) 2 MG/ML IV SOLN
1.0000 mg | INTRAVENOUS | Status: DC | PRN
  Administered 2023-03-15: 1 mg via INTRAVENOUS
  Filled 2023-03-15: qty 1

## 2023-03-15 MED ORDER — DULOXETINE HCL 30 MG PO CPEP
30.0000 mg | ORAL_CAPSULE | Freq: Every day | ORAL | Status: DC
  Administered 2023-03-17 – 2023-03-19 (×3): 30 mg via ORAL
  Filled 2023-03-15 (×3): qty 1

## 2023-03-15 MED ORDER — INSULIN GLARGINE-YFGN 100 UNIT/ML ~~LOC~~ SOLN
15.0000 [IU] | Freq: Every day | SUBCUTANEOUS | Status: DC
  Administered 2023-03-15 – 2023-03-19 (×5): 15 [IU] via SUBCUTANEOUS
  Filled 2023-03-15 (×5): qty 0.15

## 2023-03-15 MED ORDER — INSULIN ASPART 100 UNIT/ML IJ SOLN: 0.0000 [IU] | Freq: Three times a day (TID) | INTRAMUSCULAR | Status: DC

## 2023-03-15 MED ORDER — PANTOPRAZOLE SODIUM 40 MG PO TBEC
40.0000 mg | DELAYED_RELEASE_TABLET | Freq: Every day | ORAL | Status: DC
  Administered 2023-03-17 – 2023-03-19 (×3): 40 mg via ORAL
  Filled 2023-03-15 (×3): qty 1

## 2023-03-15 MED ORDER — INSULIN ASPART 100 UNIT/ML IJ SOLN: 0.0000 [IU] | Freq: Every day | INTRAMUSCULAR | Status: DC

## 2023-03-15 MED ORDER — ORAL CARE MOUTH RINSE: 15.0000 mL | OROMUCOSAL | Status: DC | PRN

## 2023-03-15 MED ORDER — LORAZEPAM 2 MG/ML IJ SOLN
INTRAMUSCULAR | Status: AC
  Filled 2023-03-15: qty 1

## 2023-03-15 NOTE — Progress Notes (Signed)
Four gold-colored earrings with clear stones removed from patient's ears (three on left, one on right) in order to complete MRI. Earrings placed in labeled specimen cup in patient's room (ICU 18).

## 2023-03-15 NOTE — Inpatient Diabetes Management (Addendum)
Inpatient Diabetes Program Recommendations  AACE/ADA: New Consensus Statement on Inpatient Glycemic Control   Target Ranges:  Prepandial:   less than 140 mg/dL      Peak postprandial:   less than 180 mg/dL (1-2 hours)      Critically ill patients:  140 - 180 mg/dL    Latest Reference Range & Units 03/14/23 23:42 03/15/23 01:15 03/15/23 02:53 03/15/23 03:40 03/15/23 04:10 03/15/23 04:41 03/15/23 05:44 03/15/23 06:45 03/15/23 07:45  Glucose-Capillary 70 - 99 mg/dL >191 (HH) >478 (HH) >295 (HH) 481 (H) 412 (H) 395 (H) 336 (H) 274 (H) 202 (H)    Latest Reference Range & Units 03/14/23 20:17 03/15/23 04:04  CO2 22 - 32 mmol/L 15 (L) 21 (L)  Glucose 70 - 99 mg/dL >6,213 (HH) 086 (H)  Anion gap 5 - 15  26 (H) 11    Latest Reference Range & Units 03/14/23 21:57 03/15/23 04:04  Beta-Hydroxybutyric Acid 0.05 - 0.27 mmol/L 6.23 (H) 0.68 (H)   Review of Glycemic Control  Diabetes history: DM2 Outpatient Diabetes medications: Lantus 25 units QHS, Humalog 10 units TID with meals plus 0-10 units for correction TID, Metformin 1000 mg BID (per [facility] notes) Current orders for Inpatient glycemic control: IV insulin  Inpatient Diabetes Program Recommendations:    Insulin: Once CBGs consistently less than 180 mg/dl and provider is ready to transition from IV to SQ insulin, please consider ordering Semglee 20 units Q24H (based on 80.7 kg x 0.25 units), CBGs Q4H, and Novolog 0-15 units Q4H.   Outpatient DM: Noted patient is prescribed Decadron 8 mg on day of FOLFIRI infusion and for 2 days following infusion.  Would recommend patient reach out to her Endocrinology team to get assistance for insulin adjustments when taking Decadron as she likely needs more insulin when steroids on board.   NOTE: Patient with DM2 hx with noted hx of pancreatic cancer was admitted on 03/14/23 with DKA, AKI, and lactic acidosis. Initial lab glucose >1200 mg/dl on 5/78/46$[id number]$ :17 and patient was started on IV insulin  which is currently still ordered. In reviewing chart, noted patient seen Lacie Draft, PA with [facility] on 01/26/23 and was prescribed Basaglar 25 units QHS, Humalog 10 units TID with meals, Humalog 0-10 units TID for correction, Metformin 1000 mg BID, and FreeStyle Libre3. Also noted A1C was 15.8% on 01/19/23. Per telephone note on 02/01/23, Basaglar required prior authorization and Lantus was preferred so basal insulin changed to Lantus 25 units QHS. Per office note on 03/07/23 by June Leap Dropkin, PA patient is prescribed Decadron 8 mg daily on day of FOLFIRI infusion and to take 2 days following infusion. Last FOLFIRI infusion given on 03/07/23 per chart. Anticipate steroids contributing to hyperglycemia and patient may need increased doses of insulin on days she takes Decadron. Would recommend patient reach out to her Endocrinology team to get assistance for insulin adjustments when taking Decadron.   Addendum 03/15/23@11 :20-Spoke with patient and her mother at bedside. Patient was asleep and opened eyes to name. Patient not able to have conversation at this time and answered "Yes" to every question asked. Patient's mother states that patient lives with her and she hasn't been good an awake today. Patient's mother states that patient uses insulin and is not able to "get that thing on her arm so she don't use it".  Inquired if she is referring to the FreeStyle Libre CGM sensor and she states she is not sure what it is. Informed patient's mother that our team  will plan to follow up with patient when she is more awake and can engage in conversation.   Thanks, Orlando Penner, RN, MSN, CDCES Diabetes Coordinator Inpatient Diabetes Program [phone] (Team Pager from 8am to 5pm)

## 2023-03-15 NOTE — Progress Notes (Addendum)
PROGRESS NOTE    Regina Barry  ZOX:[id number] DOB: 05-01-1961 DOA: 03/14/2023 PCP: Cathie Hoops, PA   Assessment & Plan:   Principal Problem:   Diabetic ketoacidosis with coma associated with type 2 diabetes mellitus (HCC) Active Problems:   Lactic acidosis   AKI (acute kidney injury) (HCC)   Acinar cell carcinoma of pancreas (HCC)   Essential hypertension   Obesity, Class III, BMI 40-49.9 (morbid obesity) (HCC)  Assessment and Plan: DKA: w/ DM2. Continue on insulin drip. Anion gap is closed so will start to wean off of insulin drip. Glargine, SSI w/ accuchecks ordered.   Acute metabolic encephalopathy: likely secondary to above. Was only drinking sweat tea at home x 2 weeks as per pt's family member at beside. CT head shows no acute intracranial abnormalities. MRI brain ordered    AKI: Cr is labile. Continue on IVFs. Avoid nephrotoxic meds   Vaginal yeast infection: started on IV fluconazole  Urinary retention: s/p I&O x cath x 1. Will continue to monitor   Lactic acidosis: continue on IVFs   Obesity: BMI 30.5. Complicates overall care   HTN: hold home dose of amlodipine, metoprolol, olmesartan, aldactone    Acinar cell carcinoma of pancreas: [facility] onco [facility]. Last chemo 03/07/23       DVT prophylaxis: lovenox  Code Status: full  Family Communication: discussed pt's care w/ pt's family at bedside and answered their questions  Disposition Plan: Depends on PT/OT recs (not consulted yet)  Level of care: Stepdown Status is: Inpatient Remains inpatient appropriate because: severity of illness   Consultants:    Procedures:   Antimicrobials:  Subjective: Pt is lethargic & confused   Objective: Vitals:   03/15/23 0600 03/15/23 0700 03/15/23 0800 03/15/23 0900  BP: 127/67 128/70 123/66 118/86  Pulse: 80 95 91 92  Resp: 20 14 15 18   Temp:   98.2 F (36.8 C)   TempSrc:   Axillary   SpO2: 99% 99% 95% 94%  Weight:      Height:         Intake/Output Summary (Last 24 hours) at 03/15/2023 0923 Last data filed at 03/15/2023 0839 Gross per 24 hour  Intake 5183.74 ml  Output 150 ml  Net 5033.74 ml   Filed Weights   03/14/23 1835 03/15/23 0330  Weight: 96 kg 80.7 kg    Examination:  General exam: Appears lethargic  Respiratory system: decreased breath sounds b/l . Cardiovascular system: S1 & S2 +. No rubs, gallops or clicks.  Gastrointestinal system: Abdomen is nondistended, soft and nontender. Normal bowel sounds heard. Central nervous system: lethargic. Moves all extremities  Psychiatry: Judgement and insight appears not at baseline. Flat mood and affect      Data Reviewed: I have personally reviewed following labs and imaging studies  CBC: Recent Labs  Lab 03/14/23 2017  WBC 8.7  NEUTROABS 6.5  HGB 14.2  HCT 47.8*  MCV 97.6  PLT 426*   Basic Metabolic Panel: Recent Labs  Lab 03/14/23 2017 03/15/23 0404  NA 128* 151*  K 4.4 3.0*  CL 87* 119*  CO2 15* 21*  GLUCOSE >1,200* 387*  BUN 49* 35*  CREATININE 2.38* 1.35*  CALCIUM 9.8 9.0   GFR: Estimated Creatinine Clearance: 46.1 mL/min (A) (by C-G formula based on SCr of 1.35 mg/dL (H)). Liver Function Tests: Recent Labs  Lab 03/14/23 2017  AST 17  ALT 14  ALKPHOS 148*  BILITOT 1.7*  PROT 8.4*  ALBUMIN 4.2   No results for  input(s): "LIPASE", "AMYLASE" in the last 168 hours. No results for input(s): "AMMONIA" in the last 168 hours. Coagulation Profile: Recent Labs  Lab 03/14/23 2017  INR 1.2   Cardiac Enzymes: No results for input(s): "CKTOTAL", "CKMB", "CKMBINDEX", "TROPONINI" in the last 168 hours. BNP (last 3 results) No results for input(s): "PROBNP" in the last 8760 hours. HbA1C: No results for input(s): "HGBA1C" in the last 72 hours. CBG: Recent Labs  Lab 03/15/23 0441 03/15/23 0544 03/15/23 0645 03/15/23 0745 03/15/23 0859  GLUCAP 395* 336* 274* 202* 182*   Lipid Profile: No results for input(s): "CHOL", "HDL",  "LDLCALC", "TRIG", "CHOLHDL", "LDLDIRECT" in the last 72 hours. Thyroid Function Tests: No results for input(s): "TSH", "T4TOTAL", "FREET4", "T3FREE", "THYROIDAB" in the last 72 hours. Anemia Panel: No results for input(s): "VITAMINB12", "FOLATE", "FERRITIN", "TIBC", "IRON", "RETICCTPCT" in the last 72 hours. Sepsis Labs: Recent Labs  Lab 03/14/23 2017 03/14/23 2157  LATICACIDVEN 4.2* 3.4*    Recent Results (from the past 240 hour(s))  Blood culture (routine x 2)     Status: None (Preliminary result)   Collection Time: 03/14/23  8:17 PM   Specimen: BLOOD  Result Value Ref Range Status   Specimen Description BLOOD BLOOD LEFT ARM  Final   Special Requests   Final    BOTTLES DRAWN AEROBIC AND ANAEROBIC Blood Culture adequate volume   Culture   Final    NO GROWTH < 12 HOURS Performed at [facility], [address], [place], [place] [address]    Report Status PENDING  Incomplete  Blood culture (routine x 2)     Status: None (Preliminary result)   Collection Time: 03/14/23  9:22 PM   Specimen: BLOOD  Result Value Ref Range Status   Specimen Description BLOOD port  Final   Special Requests   Final    BOTTLES DRAWN AEROBIC AND ANAEROBIC Blood Culture adequate volume   Culture   Final    NO GROWTH < 12 HOURS Performed at [facility], [address] [place], [place] [address]    Report Status PENDING  Incomplete  MRSA Next Gen by PCR, Nasal     Status: None   Collection Time: 03/15/23  3:47 AM   Specimen: Nasal Mucosa; Nasal Swab  Result Value Ref Range Status   MRSA by PCR Next Gen NOT DETECTED NOT DETECTED Final    Comment: (NOTE) The GeneXpert MRSA Assay (FDA approved for NASAL specimens only), is one component of a comprehensive MRSA colonization surveillance program. It is not intended to diagnose MRSA infection nor to guide or monitor treatment for MRSA infections. Test performance is not FDA approved in patients less than 56 years old. Performed at  [facility], [address] [place], [place] [address]          Radiology Studies: DG Chest [place] 1 View  Result Date: 03/14/2023 CLINICAL DATA:  Sepsis EXAM: PORTABLE CHEST 1 VIEW COMPARISON:  02/24/2021 FINDINGS: Lung volumes are small. No pneumothorax or pleural effusion. Right internal jugular chest port tip is seen within the superior vena cava. Cardiac size within normal limits. Pulmonary vascularity is normal. No acute bone abnormality. IMPRESSION: 1. Pulmonary hypoinflation. Electronically Signed   By: Helyn Numbers M.D.   On: 03/14/2023 21:47   CT Head Wo Contrast  Result Date: 03/14/2023 CLINICAL DATA:  Altered mental status. EXAM: CT HEAD WITHOUT CONTRAST TECHNIQUE: Contiguous axial images were obtained from the base of the skull through the vertex without intravenous contrast. RADIATION DOSE  REDUCTION: This exam was performed according to the departmental dose-optimization program which includes automated exposure control, adjustment of the mA and/or kV according to patient size and/or use of iterative reconstruction technique. COMPARISON:  None Available. FINDINGS: Brain: There is mild cerebral atrophy with widening of the extra-axial spaces and ventricular dilatation. There are areas of decreased attenuation within the white matter tracts of the supratentorial brain, consistent with microvascular disease changes. Vascular: No hyperdense vessel or unexpected calcification. Skull: Normal. Negative for fracture or focal lesion. Sinuses/Orbits: No acute finding. Other: None. IMPRESSION: 1. No acute intracranial abnormality. Electronically Signed   By: Aram Candela M.D.   On: 03/14/2023 20:34        Scheduled Meds:  Chlorhexidine Gluconate Cloth  6 each Topical Daily   enoxaparin (LOVENOX) injection  40 mg Subcutaneous Q24H   Continuous Infusions:  dextrose 5% lactated ringers 125 mL/hr at 03/15/23 0839   insulin 2.4 Units/hr (03/15/23 0839)   lactated ringers  Stopped (03/15/23 0803)   lactated ringers     potassium chloride 100 mL/hr at 03/15/23 0839     LOS: 1 day    Time spent: 35 mins     Charise Killian, MD [facility] Pager [phone]  If 7PM-7AM, please contact night-coverage [email or web address] 03/15/2023, 9:23 AM

## 2023-03-16 ENCOUNTER — Inpatient Hospital Stay: Payer: Medicaid Other

## 2023-03-16 DIAGNOSIS — C259 Malignant neoplasm of pancreas, unspecified: Secondary | ICD-10-CM | POA: Diagnosis not present

## 2023-03-16 DIAGNOSIS — R109 Unspecified abdominal pain: Secondary | ICD-10-CM | POA: Diagnosis not present

## 2023-03-16 DIAGNOSIS — N179 Acute kidney failure, unspecified: Secondary | ICD-10-CM | POA: Diagnosis not present

## 2023-03-16 DIAGNOSIS — E1111 Type 2 diabetes mellitus with ketoacidosis with coma: Secondary | ICD-10-CM | POA: Diagnosis not present

## 2023-03-16 DIAGNOSIS — R4182 Altered mental status, unspecified: Secondary | ICD-10-CM | POA: Diagnosis not present

## 2023-03-16 DIAGNOSIS — I700 Atherosclerosis of aorta: Secondary | ICD-10-CM | POA: Diagnosis not present

## 2023-03-16 LAB — LACTIC ACID, PLASMA: Lactic Acid, Venous: 2 mmol/L (ref 0.5–1.9)

## 2023-03-16 LAB — URINE CULTURE: Culture: NO GROWTH

## 2023-03-16 LAB — CBC
HCT: 33.5 % — ABNORMAL LOW (ref 36.0–46.0)
Hemoglobin: 10.9 g/dL — ABNORMAL LOW (ref 12.0–15.0)
MCH: 29.6 pg (ref 26.0–34.0)
MCHC: 32.5 g/dL (ref 30.0–36.0)
MCV: 91 fL (ref 80.0–100.0)
Platelets: 305 10*3/uL (ref 150–400)
RBC: 3.68 MIL/uL — ABNORMAL LOW (ref 3.87–5.11)
RDW: 15.1 % (ref 11.5–15.5)
WBC: 12.1 10*3/uL — ABNORMAL HIGH (ref 4.0–10.5)
nRBC: 0.3 % — ABNORMAL HIGH (ref 0.0–0.2)

## 2023-03-16 LAB — GLUCOSE, CAPILLARY
Glucose-Capillary: 181 mg/dL — ABNORMAL HIGH (ref 70–99)
Glucose-Capillary: 201 mg/dL — ABNORMAL HIGH (ref 70–99)
Glucose-Capillary: 212 mg/dL — ABNORMAL HIGH (ref 70–99)
Glucose-Capillary: 226 mg/dL — ABNORMAL HIGH (ref 70–99)
Glucose-Capillary: 228 mg/dL — ABNORMAL HIGH (ref 70–99)
Glucose-Capillary: 251 mg/dL — ABNORMAL HIGH (ref 70–99)
Glucose-Capillary: 265 mg/dL — ABNORMAL HIGH (ref 70–99)

## 2023-03-16 LAB — COMPREHENSIVE METABOLIC PANEL
ALT: 9 U/L (ref 0–44)
AST: 17 U/L (ref 15–41)
Albumin: 2.8 g/dL — ABNORMAL LOW (ref 3.5–5.0)
Alkaline Phosphatase: 86 U/L (ref 38–126)
Anion gap: 8 (ref 5–15)
BUN: 19 mg/dL (ref 6–20)
CO2: 22 mmol/L (ref 22–32)
Calcium: 9.7 mg/dL (ref 8.9–10.3)
Chloride: 123 mmol/L — ABNORMAL HIGH (ref 98–111)
Creatinine, Ser: 1.09 mg/dL — ABNORMAL HIGH (ref 0.44–1.00)
GFR, Estimated: 59 mL/min — ABNORMAL LOW (ref >60)
Glucose, Bld: 246 mg/dL — ABNORMAL HIGH (ref 70–99)
Potassium: 3.9 mmol/L (ref 3.5–5.1)
Sodium: 153 mmol/L — ABNORMAL HIGH (ref 135–145)
Total Bilirubin: 0.3 mg/dL (ref 0.3–1.2)
Total Protein: 5.9 g/dL — ABNORMAL LOW (ref 6.5–8.1)

## 2023-03-16 LAB — SODIUM
Sodium: 148 mmol/L — ABNORMAL HIGH (ref 135–145)
Sodium: 148 mmol/L — ABNORMAL HIGH (ref 135–145)

## 2023-03-16 MED ORDER — HYDROMORPHONE HCL 1 MG/ML IJ SOLN
1.0000 mg | INTRAMUSCULAR | Status: DC | PRN
  Administered 2023-03-16 – 2023-03-17 (×5): 1 mg via INTRAVENOUS
  Filled 2023-03-16 (×5): qty 1

## 2023-03-16 MED ORDER — DEXTROSE 5 % IV SOLN: INTRAVENOUS | Status: DC

## 2023-03-16 NOTE — Progress Notes (Signed)
Just now patient told her family that her pain was in her side. For most of the day patient has been medicated for pain but unable to verbalize the location. Last dose of dilaudid 1 mg was given at 1440. Pt continues to be in and out of sleep/rest but has had positive responses with daughters and cousin and mother at the bedside.Follows simple commands and is easily redirected.  Pt does not like to lie flat she stated. Head is elevated at this time at 45 degrees and BP is 155/85, respirations are 16 and patient is snoring it seems. Continues to use 2 Liters of oxygen via [place].  Pt does not wear a CPAP at home. Plan of care continues as ordered.  Oncologist Dr. Cathie Hoops did consult with family and the patient today. Chaplain visited with family today as well.  Metabolic: Add All Ability to maintain appropriate glucose levels will improve Add Today at 0923 - Progressing by Alver Fisher, RN

## 2023-03-16 NOTE — Consult Note (Signed)
Hematology/Oncology Consult note [phone] [phone]      Patient Care Team: Cathie Hoops, [place] as PCP - General (Family Medicine)   Name of the patient: Regina Barry  [id number]  [birth year]   REASON FOR COSULTATION:   History of presenting illness-  62 y.o. female with PMH listed at below who presents to ER for evaluation of lethargy, confusion and somnolence. Her workup was consistent with DKA with a glucose level over 1200.  Anion gap of 26.  Creatinine 2.38, increased from her baseline of 0.9.  Sodium level initially 128, progressively increased to 153.  CT head without contrast showed no acute abnormality.  Chest x-ray showed no acute findings. Patient was admitted to ICU for DKA treatment protocol.   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  Her primary oncology care was at [facility] Patient was originally diagnosed in July 2022, status post distal pancreectomy/splenectomy.  Pathology showed invasive pancreatic carcinoma, favor pancreatic acinar cell carcinoma, 0 out of 21 lymph nodes were involved.  PT3 N0 Patient received adjuvant FOLFIRINOX and finished all treatments in March 2023.  Patient went on surveillance and was found to have metastasis on 11/01/2022 CT scan.  There was a new 10 mm left retroperitoneal soft tissue 01/17/2023, CT demonstrates increased size of the left upper quadrant retroperitoneal nodule consistent with metastasis.  Unable to biopsy.  CA 19-9 87. Patient was systemic chemotherapy treatments with FOLFIRI.  Her last treatment was on 03/07/2023.  Patient has a history of poorly controlled diabetes and was seeing endocrinology.  Oncologist was consulted as patient's family has questions for oncology Patient was seen at bedside she is unable to provide any history due to acute encephalopathy.  Patient's cousin and daughter were at the bedside.     Allergies  Allergen Reactions    Diflucortolone    Doxycycline    Latex    Penicillins     Patient Active Problem List   Diagnosis Date Noted   Diabetic ketoacidosis with coma associated with type 2 diabetes mellitus (HCC) 03/14/2023   Lactic acidosis 03/14/2023   AKI (acute kidney injury) (HCC) 03/14/2023   Obesity, Class III, BMI 40-49.9 (morbid obesity) (HCC) 03/14/2023   Acinar cell carcinoma of pancreas (HCC) 06/30/2021   Essential hypertension 10/17/2013     Past Medical History:  Diagnosis Date   Diabetes mellitus without complication (HCC)    Hypertension      History reviewed. No pertinent surgical history.  Social History   Socioeconomic History   Marital status: Single    Spouse name: Not on file   Number of children: Not on file   Years of education: Not on file   Highest education level: Not on file  Occupational History   Not on file  Tobacco Use   Smoking status: Never   Smokeless tobacco: Never  Substance and Sexual Activity   Alcohol use: Not Currently   Drug use: Not on file   Sexual activity: Not on file  Other Topics Concern   Not on file  Social History Narrative   Not on file   Social Determinants of Health   Financial Resource Strain: Not on file  Food Insecurity: Not on file  Transportation Needs: Not on file  Physical Activity: Not on file  Stress: Not on file  Social Connections: Not on file  Intimate Partner Violence: Not on file     Family History  Problem Relation Age of Onset  Breast cancer Mother 49   Breast cancer Paternal Aunt    Breast cancer Cousin      Current Facility-Administered Medications:    Chlorhexidine Gluconate Cloth 2 % PADS 6 each, 6 each, Topical, Daily, Andris Baumann, MD, 6 each at 03/16/23 0810   dextrose 5 % solution, , Intravenous, Continuous, Charise Killian, MD, Last Rate: 100 mL/hr at 03/16/23 1354, Infusion Verify at 03/16/23 1354   dextrose 50 % solution 0-50 mL, 0-50 mL, Intravenous, PRN, Jene Every, MD    DULoxetine (CYMBALTA) DR capsule 30 mg, 30 mg, Oral, Daily, Mayford Knife, Jamiese M, MD   enoxaparin (LOVENOX) injection 40 mg, 40 mg, Subcutaneous, Q24H, Lindajo Royal V, MD, 40 mg at 03/16/23 0809   fluconazole (DIFLUCAN) IVPB 200 mg, 200 mg, Intravenous, Q24H, Charise Killian, MD, Last Rate: 100 mL/hr at 03/16/23 1616, 200 mg at 03/16/23 1616   HYDROmorphone (DILAUDID) injection 1 mg, 1 mg, Intravenous, Q2H PRN, Charise Killian, MD, 1 mg at 03/16/23 1440   insulin aspart (novoLOG) injection 0-15 Units, 0-15 Units, Subcutaneous, Q4H, Mansy, Jan A, MD, 8 Units at 03/16/23 1612   insulin glargine-yfgn (SEMGLEE) injection 15 Units, 15 Units, Subcutaneous, Daily, Charise Killian, MD, 15 Units at 03/16/23 1036   lactated ringers infusion, , Intravenous, Continuous, Jene Every, MD   Oral care mouth rinse, 15 mL, Mouth Rinse, PRN, Andris Baumann, MD   pantoprazole (PROTONIX) EC tablet 40 mg, 40 mg, Oral, Daily, Charise Killian, MD  Review of Systems  Unable to perform ROS: Mental status change    PHYSICAL EXAM Vitals:   03/16/23 1300 03/16/23 1400 03/16/23 1500 03/16/23 1600  BP: 137/75 (!) 158/92 133/75 (!) 176/158  Pulse: 95 96 92 99  Resp: 14 16 12 19   Temp: 97.7 F (36.5 C)   98.6 F (37 C)  TempSrc: Axillary   Oral  SpO2: 97% 97% 95% 95%  Weight:      Height:       Physical Exam Constitutional:      General: She is not in acute distress. HENT:     Head: Normocephalic.  Eyes:     General: No scleral icterus.    Pupils: Pupils are equal, round, and reactive to light.  Cardiovascular:     Rate and Rhythm: Normal rate and regular rhythm.  Pulmonary:     Effort: Pulmonary effort is normal. No respiratory distress.  Abdominal:     General: There is no distension.     Palpations: Abdomen is soft.  Musculoskeletal:        General: Normal range of motion.     Cervical back: Normal range of motion and neck supple.  Skin:    General: Skin is warm and dry.      Findings: No erythema.  Neurological:     Motor: No abnormal muscle tone.     Comments: Lethargic  Psychiatric:        Mood and Affect: Affect normal.       LABORATORY STUDIES    Latest Ref Rng & Units 03/16/2023    4:05 AM 03/14/2023    8:17 PM 02/24/2021   12:15 AM  CBC  WBC 4.0 - 10.5 K/uL 12.1  8.7  8.1   Hemoglobin 12.0 - 15.0 g/dL 16.1  09.6  04.5   Hematocrit 36.0 - 46.0 % 33.5  47.8  36.2   Platelets 150 - 400 K/uL 305  426  284       Latest  Ref Rng & Units 03/16/2023    1:00 PM 03/16/2023    4:05 AM 03/15/2023    4:10 PM  CMP  Glucose 70 - 99 mg/dL  161  096   BUN 6 - 20 mg/dL  19  24   Creatinine 0.45 - 1.00 mg/dL  4.09  8.11   Sodium 914 - 145 mmol/L 148  153  153   Potassium 3.5 - 5.1 mmol/L  3.9  3.1   Chloride 98 - 111 mmol/L  123  121   CO2 22 - 32 mmol/L  22  23   Calcium 8.9 - 10.3 mg/dL  9.7  7.9   Total Protein 6.5 - 8.1 g/dL  5.9    Total Bilirubin 0.3 - 1.2 mg/dL  0.3    Alkaline Phos 38 - 126 U/L  86    AST 15 - 41 U/L  17    ALT 0 - 44 U/L  9       RADIOGRAPHIC STUDIES: I have personally reviewed the radiological images as listed and agreed with the findings in the report. CT ABDOMEN PELVIS WO CONTRAST  Result Date: 03/16/2023 CLINICAL DATA:  Abdominal pain, acute, nonlocalized. Question renal/ureteral stone. Personal history of prostate cancer. EXAM: CT ABDOMEN AND PELVIS WITHOUT CONTRAST TECHNIQUE: Multidetector CT imaging of the abdomen and pelvis was performed following the standard protocol without IV contrast. RADIATION DOSE REDUCTION: This exam was performed according to the departmental dose-optimization program which includes automated exposure control, adjustment of the mA and/or kV according to patient size and/or use of iterative reconstruction technique. COMPARISON:  CT of the abdomen and pelvis 06/09/2012. One-view abdominal radiograph 03/15/2023. FINDINGS: Lower chest: Linear airspace opacities are present at the left base. Minimal  atelectasis is present at the right base. The heart size is normal. No significant pleural or pericardial effusion is present. Hepatobiliary: No focal liver abnormality is seen. No gallstones, gallbladder wall thickening, or biliary dilatation. The liver is somewhat distended. Pancreas: Partial resection of the pancreas scratched at partial resection of pancreatic tail noted. The remaining pancreas is within normal limits. Spleen: Splenectomy noted. A focus of splenic tissue is present along the inferior aspect of the posterior left hemidiaphragm. Adrenals/Urinary Tract: The adrenal glands are normal bilaterally. The kidneys are unremarkable. No stone or mass lesion is present. Ureters are normal bilaterally. The urinary bladder is normal. Stomach/Bowel: The stomach and duodenum are within normal limits. Small bowel is unremarkable. Terminal ileum is normal. The appendix is visualized and within normal limits. The ascending and transverse colon are normal. The descending and sigmoid colon are normal. Vascular/Lymphatic: Scattered atherosclerotic calcifications are present in the aorta and branch vessels. No aneurysm is present. No significant adenopathy is present. Reproductive: Uterus and bilateral adnexa are unremarkable. Other: No abdominal wall hernia or abnormality. No abdominopelvic ascites. Musculoskeletal: Grade 1 anterolisthesis at L4-5 measures 8 mm. Uncovering of the disc and bilateral facet spurring results in severe central canal stenosis at this level. Moderate foraminal narrowing is present bilaterally. Slightly less severe disc disease and central canal stenosis is present at L2-3 and L3-4. Rightward curvature of the lower lumbar spine is noted. No focal osseous lesions are present. The hips are located and within normal limits. IMPRESSION: 1. No acute or focal lesion to explain the patient's symptoms. 2. 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 3. Grade 1 anterolisthesis at L4-5  with severe central canal stenosis and moderate foraminal narrowing bilaterally. 4. Slightly less severe disc disease and central  canal stenosis at L2-3 and L3-4. 5.  Aortic Atherosclerosis (ICD10-I70.0). Electronically Signed   By: Marin Roberts M.D.   On: 03/16/2023 16:12   MR BRAIN WO CONTRAST  Result Date: 03/15/2023 CLINICAL DATA:  Mental status change, unknown cause. EXAM: MRI HEAD WITHOUT CONTRAST TECHNIQUE: Multiplanar, multiecho pulse sequences of the brain and surrounding structures were obtained without intravenous contrast. COMPARISON:  Head CT 03/14/2023 FINDINGS: The study is mildly motion degraded despite utilizing motion resistant imaging protocols and repeat imaging. Brain: There is no evidence of an acute infarct, mass, midline shift, or extra-axial fluid collection. A few punctate foci of T2 FLAIR hyperintensity in the cerebral white matter are nonspecific but may reflect minimal chronic small vessel ischemic disease and are not considered abnormal for age. A single chronic microhemorrhage is suspected in the posterior left frontal lobe. There is a partially empty sella. There is mild generalized cerebral atrophy. Vascular: Major intracranial vascular flow voids are preserved. Skull and upper cervical spine: Unremarkable bone marrow signal para Sinuses/Orbits: Unremarkable orbits. Clear paranasal sinuses. No significant mastoid fluid. Other: None. IMPRESSION: No acute intracranial abnormality. Electronically Signed   By: Sebastian Ache M.D.   On: 03/15/2023 22:00   DG Abd Portable 1V  Result Date: 03/15/2023 CLINICAL DATA:  Abdominal pain EXAM: PORTABLE ABDOMEN - 1 VIEW COMPARISON:  03/28/2011 FINDINGS: Bowel gas pattern is nonspecific. There is 1.3 cm calcific density in left paraspinal region at the L2 level in the course of left ureter. Kidneys are partly obscured by bowel contents. Phleboliths are seen in pelvis. IMPRESSION: Nonspecific bowel gas pattern. There is 1.3 cm calcific  density in left paraspinal region in the proximal course of left ureter. If there is clinical suspicion for ureteric calculus, follow-up CT may be considered. Electronically Signed   By: Ernie Avena M.D.   On: 03/15/2023 13:42   DG Chest Port 1 View  Result Date: 03/14/2023 CLINICAL DATA:  Sepsis EXAM: PORTABLE CHEST 1 VIEW COMPARISON:  02/24/2021 FINDINGS: Lung volumes are small. No pneumothorax or pleural effusion. Right internal jugular chest port tip is seen within the superior vena cava. Cardiac size within normal limits. Pulmonary vascularity is normal. No acute bone abnormality. IMPRESSION: 1. Pulmonary hypoinflation. Electronically Signed   By: Helyn Numbers M.D.   On: 03/14/2023 21:47   CT Head Wo Contrast  Result Date: 03/14/2023 CLINICAL DATA:  Altered mental status. EXAM: CT HEAD WITHOUT CONTRAST TECHNIQUE: Contiguous axial images were obtained from the base of the skull through the vertex without intravenous contrast. RADIATION DOSE REDUCTION: This exam was performed according to the departmental dose-optimization program which includes automated exposure control, adjustment of the mA and/or kV according to patient size and/or use of iterative reconstruction technique. COMPARISON:  None Available. FINDINGS: Brain: There is mild cerebral atrophy with widening of the extra-axial spaces and ventricular dilatation. There are areas of decreased attenuation within the white matter tracts of the supratentorial brain, consistent with microvascular disease changes. Vascular: No hyperdense vessel or unexpected calcification. Skull: Normal. Negative for fracture or focal lesion. Sinuses/Orbits: No acute finding. Other: None. IMPRESSION: 1. No acute intracranial abnormality. Electronically Signed   By: Aram Candela M.D.   On: 03/14/2023 20:34     Assessment and plan-   Recurrent pancreatic cancer Her primary oncology care was at [facility]. Patient's family members have questions about whether  patient will receive chemotherapy during current admission. I had a lengthy discussion with family and explained to them that no chemotherapy is recommended until patient  recovers from her acute issue.  She should follow-up outpatient with her [facility] oncologist after discharge.  # DKA, patient has a longstanding history of poorly controlled diabetes.  Per family, she is not compliant using insulin. DKA protocol managed by ICU Recommend patient to follow-up closely with endocrinology after discharge.  # Acute kidney injury, avoid nephrotoxins.  Expect to improve with the treatment of DKA. # Acute encephalopathy, CT/MRI was negative for acute intracranial abnormalities. Likely due to DKA and hypernatremia, treatment managed by ICU/hospitalist team.  Thank you for allowing me to participate in the care of this patient.   Rickard Patience, MD, PhD Hematology Oncology 03/16/2023

## 2023-03-16 NOTE — Progress Notes (Addendum)
PROGRESS NOTE    Regina Barry  ZOX:[id number] DOB: 02-22-1961 DOA: 03/14/2023 PCP: Cathie Hoops, PA   Assessment & Plan:   Principal Problem:   Diabetic ketoacidosis with coma associated with type 2 diabetes mellitus (HCC) Active Problems:   Lactic acidosis   AKI (acute kidney injury) (HCC)   Acinar cell carcinoma of pancreas (HCC)   Essential hypertension   Obesity, Class III, BMI 40-49.9 (morbid obesity) (HCC)  Assessment and Plan: DKA: w/ DM2. Continue on insulin drip. Anion gap is closed so will start to wean off of insulin drip. Glargine, SSI w/ accuchecks ordered.   Acute metabolic encephalopathy: likely secondary to DKA vs hypernatremia. Was only drinking sweat tea at home x 2 weeks as per pt's family member at beside. CT head shows no acute intracranial abnormalities. MRI brain shows no acute intracranial findings  Hypernatremia: free water deficit 2.3L. Continue on IVFs. Na level is trending down. Repeat Na level ordered.   Abd pain: likely secondary to pancreatic cancer vs ureter stone. CT abd/pelvis ordered    AKI: Cr is trending down today. Avoid nephrotoxic meds   Vaginal yeast infection: continue on IV fluconazole   Urinary retention: s/p I&O x cath x 2. Foley placed 03/16/23   Lactic acidosis: trending down. Continue on IVFs    Obesity: BMI 30.5. 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 Sees onco at [facility]. Last chemo 03/07/23. Onco consulted  Normocytic anemia: likely secondary to recent chemo. No need for a transfusion currently   Hypokalemia: WNL today      DVT prophylaxis: lovenox  Code Status: full  Family Communication: discussed pt's care w/ pt's family at bedside and answered their questions  Disposition Plan: Depends on PT/OT recs (not consulted yet)  Level of care:  Stepdown Status is: Inpatient Remains inpatient appropriate because: severity of illness   Consultants:    Procedures:   Antimicrobials:  Subjective: Pt is lethargic.  Objective: Vitals:   03/16/23 1000 03/16/23 1100 03/16/23 1300 03/16/23 1400  BP: 132/88 (!) 143/81 137/75 (!) 158/92  Pulse: 96 91 95 96  Resp:   14 16  Temp:   97.7 F (36.5 C)   TempSrc:   Axillary   SpO2: 94% 91% 97% 97%  Weight:      Height:        Intake/Output Summary (Last 24 hours) at 03/16/2023 1457 Last data filed at 03/16/2023 1354 Gross per 24 hour  Intake 2263.48 ml  Output 1325 ml  Net 938.48 ml   Filed Weights   03/14/23 1835 03/15/23 0330  Weight: 96 kg 80.7 kg    Examination:  General exam: appears lethargic  Respiratory system: diminished breath sounds b/l  Cardiovascular system: S1/S2+. No rubs or clicks  Gastrointestinal system: Abd is soft, NT, obese & hypoactive bowel sounds Central nervous system: lethargic. Moves all extremities  Psychiatry: judgement and insight appears not at baseline     Data Reviewed: I have personally reviewed following labs and imaging studies  CBC: Recent Labs  Lab 03/14/23 2017 03/16/23 0405  WBC 8.7 12.1*  NEUTROABS 6.5  --   HGB 14.2 10.9*  HCT 47.8* 33.5*  MCV 97.6 91.0  PLT 426* 305   Basic Metabolic Panel: Recent Labs  Lab 03/15/23 0404 03/15/23 0808 03/15/23 1151 03/15/23 1610 03/16/23 0405 03/16/23 1300  NA 151* 153* 153* 153* 153* 148*  K 3.0* 3.4* 3.6 3.1* 3.9  --   CL 119* 120* 122* 121* 123*  --   CO2 21* 23 21* 23 22  --   GLUCOSE 387* 174* 183* 172* 246*  --   BUN 35* 30* 30* 24* 19  --   CREATININE 1.35* 1.19* 1.26* 1.14* 1.09*  --   CALCIUM 9.0 8.3* 9.1 7.9* 9.7  --    GFR: Estimated Creatinine Clearance: 57.1 mL/min (A) (by C-G formula based on SCr of 1.09 mg/dL (H)). Liver Function Tests: Recent Labs  Lab 03/14/23 2017 03/16/23 0405  AST 17 17  ALT 14 9  ALKPHOS 148* 86  BILITOT 1.7* 0.3  PROT  8.4* 5.9*  ALBUMIN 4.2 2.8*   Recent Labs  Lab 03/15/23 1610  LIPASE 331*   No results for input(s): "AMMONIA" in the last 168 hours. Coagulation Profile: Recent Labs  Lab 03/14/23 2017  INR 1.2   Cardiac Enzymes: No results for input(s): "CKTOTAL", "CKMB", "CKMBINDEX", "TROPONINI" in the last 168 hours. BNP (last 3 results) No results for input(s): "PROBNP" in the last 8760 hours. HbA1C: No results for input(s): "HGBA1C" in the last 72 hours. CBG: Recent Labs  Lab 03/15/23 2316 03/16/23 0139 03/16/23 0322 03/16/23 0729 03/16/23 1117  GLUCAP 283* 251* 226* 181* 228*   Lipid Profile: No results for input(s): "CHOL", "HDL", "LDLCALC", "TRIG", "CHOLHDL", "LDLDIRECT" in the last 72 hours. Thyroid Function Tests: No results for input(s): "TSH", "T4TOTAL", "FREET4", "T3FREE", "THYROIDAB" in the last 72 hours. Anemia Panel: No results for input(s): "VITAMINB12", "FOLATE", "FERRITIN", "TIBC", "IRON", "RETICCTPCT" in the last 72 hours. Sepsis Labs: Recent Labs  Lab 03/14/23 2017 03/14/23 2157 03/16/23 1341  LATICACIDVEN 4.2* 3.4* 2.0*    Recent Results (from the past 240 hour(s))  Blood culture (routine x 2)     Status: None (Preliminary result)   Collection Time: 03/14/23  8:17 PM   Specimen: BLOOD  Result Value Ref Range Status   Specimen Description BLOOD BLOOD LEFT ARM  Final   Special Requests   Final    BOTTLES DRAWN AEROBIC AND ANAEROBIC Blood Culture adequate volume   Culture   Final    NO GROWTH < 12 HOURS Performed at [facility], [address] [place], [place] [address]    Report Status PENDING  Incomplete  Blood culture (routine x 2)     Status: None (Preliminary result)   Collection Time: 03/14/23  9:22 PM   Specimen: BLOOD  Result Value Ref Range Status   Specimen Description BLOOD port  Final   Special Requests   Final    BOTTLES DRAWN AEROBIC AND ANAEROBIC Blood Culture adequate volume   Culture   Final    NO GROWTH < 12  HOURS Performed at [facility], [address] [place], [place] [address]    Report Status PENDING  Incomplete  Urine Culture     Status: None   Collection Time: 03/14/23  9:22 PM   Specimen: Urine, Random  Result Value Ref Range Status   Specimen Description   Final    URINE, RANDOM Performed at [facility], [address] [place], [place] [address]    Special Requests   Final    NONE Performed at [facility], [address], [place], [place] [address]    Culture   Final    NO GROWTH Performed at [facility], [address] [place], [place] [address]  Report Status 03/16/2023 FINAL  Final  MRSA Next Gen by PCR, Nasal     Status: None   Collection Time: 03/15/23  3:47 AM   Specimen: Nasal Mucosa; Nasal Swab  Result Value Ref Range Status   MRSA by PCR Next Gen NOT DETECTED NOT DETECTED Final    Comment: (NOTE) The GeneXpert MRSA Assay (FDA approved for NASAL specimens only), is one component of a comprehensive MRSA colonization surveillance program. It is not intended to diagnose MRSA infection nor to guide or monitor treatment for MRSA infections. Test performance is not FDA approved in patients less than 50 years old. Performed at [facility], [address] [place], [place] [address]          Radiology Studies: MR BRAIN WO CONTRAST  Result Date: 03/15/2023 CLINICAL DATA:  Mental status change, unknown cause. EXAM: MRI HEAD WITHOUT CONTRAST TECHNIQUE: Multiplanar, multiecho pulse sequences of the brain and surrounding structures were obtained without intravenous contrast. COMPARISON:  Head CT 03/14/2023 FINDINGS: The study is mildly motion degraded despite utilizing motion resistant imaging protocols and repeat imaging. Brain: There is no evidence of an acute infarct, mass, midline shift, or extra-axial fluid collection. A few punctate foci of T2 FLAIR hyperintensity in the cerebral white matter are nonspecific but  may reflect minimal chronic small vessel ischemic disease and are not considered abnormal for age. A single chronic microhemorrhage is suspected in the posterior left frontal lobe. There is a partially empty sella. There is mild generalized cerebral atrophy. Vascular: Major intracranial vascular flow voids are preserved. Skull and upper cervical spine: Unremarkable bone marrow signal para Sinuses/Orbits: Unremarkable orbits. Clear paranasal sinuses. No significant mastoid fluid. Other: None. IMPRESSION: No acute intracranial abnormality. Electronically Signed   By: Sebastian Ache M.D.   On: 03/15/2023 22:00   DG Abd Portable 1V  Result Date: 03/15/2023 CLINICAL DATA:  Abdominal pain EXAM: PORTABLE ABDOMEN - 1 VIEW COMPARISON:  03/28/2011 FINDINGS: Bowel gas pattern is nonspecific. There is 1.3 cm calcific density in left paraspinal region at the L2 level in the course of left ureter. Kidneys are partly obscured by bowel contents. Phleboliths are seen in pelvis. IMPRESSION: Nonspecific bowel gas pattern. There is 1.3 cm calcific density in left paraspinal region in the proximal course of left ureter. If there is clinical suspicion for ureteric calculus, follow-up CT may be considered. Electronically Signed   By: Ernie Avena M.D.   On: 03/15/2023 13:42   DG Chest Port 1 View  Result Date: 03/14/2023 CLINICAL DATA:  Sepsis EXAM: PORTABLE CHEST 1 VIEW COMPARISON:  02/24/2021 FINDINGS: Lung volumes are small. No pneumothorax or pleural effusion. Right internal jugular chest port tip is seen within the superior vena cava. Cardiac size within normal limits. Pulmonary vascularity is normal. No acute bone abnormality. IMPRESSION: 1. Pulmonary hypoinflation. Electronically Signed   By: Helyn Numbers M.D.   On: 03/14/2023 21:47   CT Head Wo Contrast  Result Date: 03/14/2023 CLINICAL DATA:  Altered mental status. EXAM: CT HEAD WITHOUT CONTRAST TECHNIQUE: Contiguous axial images were obtained from the base of  the skull through the vertex without intravenous contrast. RADIATION DOSE REDUCTION: This exam was performed according to the departmental dose-optimization program which includes automated exposure control, adjustment of the mA and/or kV according to patient size and/or use of iterative reconstruction technique. COMPARISON:  None Available. FINDINGS: Brain: There is mild cerebral atrophy with widening of the extra-axial spaces and ventricular dilatation. There are areas of decreased attenuation within the white  matter tracts of the supratentorial brain, consistent with microvascular disease changes. Vascular: No hyperdense vessel or unexpected calcification. Skull: Normal. Negative for fracture or focal lesion. Sinuses/Orbits: No acute finding. Other: None. IMPRESSION: 1. No acute intracranial abnormality. Electronically Signed   By: Aram Candela M.D.   On: 03/14/2023 20:34        Scheduled Meds:  Chlorhexidine Gluconate Cloth  6 each Topical Daily   DULoxetine  30 mg Oral Daily   enoxaparin (LOVENOX) injection  40 mg Subcutaneous Q24H   insulin aspart  0-15 Units Subcutaneous Q4H   insulin glargine-yfgn  15 Units Subcutaneous Daily   pantoprazole  40 mg Oral Daily   Continuous Infusions:  dextrose 100 mL/hr at 03/16/23 1354   fluconazole (DIFLUCAN) IV Stopped (03/15/23 1846)   lactated ringers       LOS: 2 days    Time spent: 35 mins     Charise Killian, MD [facility] Pager [phone]  If 7PM-7AM, please contact night-coverage [email or web address] 03/16/2023, 2:57 PM

## 2023-03-16 NOTE — Inpatient Diabetes Management (Signed)
Inpatient Diabetes Program Recommendations  AACE/ADA: New Consensus Statement on Inpatient Glycemic Control (2015)  Target Ranges:  Prepandial:   less than 140 mg/dL      Peak postprandial:   less than 180 mg/dL (1-2 hours)      Critically ill patients:  140 - 180 mg/dL    Latest Reference Range & Units 03/15/23 12:51 03/15/23 13:53 03/15/23 14:49 03/15/23 16:03 03/15/23 17:53 03/15/23 20:01  Glucose-Capillary 70 - 99 mg/dL 161 (H)  IV Insulin Drip 177 (H) 199 (H) 161 (H) 186 (H)  15 units Semglee 180 (H)  IV Insulin drip Stopped  (H): Data is abnormally high  Latest Reference Range & Units 03/15/23 23:16 03/16/23 01:39 03/16/23 03:22 03/16/23 07:29  Glucose-Capillary 70 - 99 mg/dL 096 (H)  8 units Novolog  251 (H) 226 (H)  5 units Novolog  181 (H)  3 units Novolog   (H): Data is abnormally high     Home DM Meds:  Lantus 25 units QHS       Humalog 10 units TID with meals plus 0-10 units for correction TID       Metformin 1000 mg BID (per [facility] notes)    Current Orders: Semglee 15 units Daily      Novolog Moderate Correction Scale/ SSI (0-15 units) Q4 hours    MD- Please consider Increasing the Semglee to 20 units Daily  If 15 unit dose already given this AM, please also give an extra 5 units Semglee X 1 dose this AM as well    --Will follow patient during hospitalization--  Ambrose Finland RN, MSN, CDCES Diabetes Coordinator Inpatient Glycemic Control Team Team Pager: [phone] (8a-5p)

## 2023-03-16 NOTE — Progress Notes (Signed)
   03/16/23 1500  Spiritual Encounters  Type of Visit Initial  Care provided to: Patient  Conversation partners present during encounter Nurse  Referral source Nurse (RN/NT/LPN)  Reason for visit Advance directives  OnCall Visit Yes  Spiritual Framework  Presenting Themes Meaning/purpose/sources of inspiration;Significant life change;Impactful experiences and emotions  Community/Connection Family;Friend(s)  Patient Stress Factors None identified  Family Stress Factors None identified   Chaplain received page for ADR. Chaplain spoke with the nurse and the daughter of the patient. Daughter would like for her sister to be here when the patient signs the advanced directive. Chaplain explained that the patient has to be awake and coherent to sign the paperwork. Nurse informed me that she will let the nurse team know tomorrow. Chaplain offered words of hope and encouragement to the patients daughter and cousin.

## 2023-03-17 DIAGNOSIS — C252 Malignant neoplasm of tail of pancreas: Secondary | ICD-10-CM | POA: Diagnosis not present

## 2023-03-17 DIAGNOSIS — E1111 Type 2 diabetes mellitus with ketoacidosis with coma: Secondary | ICD-10-CM | POA: Diagnosis not present

## 2023-03-17 DIAGNOSIS — C259 Malignant neoplasm of pancreas, unspecified: Secondary | ICD-10-CM | POA: Diagnosis not present

## 2023-03-17 LAB — CBC
HCT: 29.6 % — ABNORMAL LOW (ref 36.0–46.0)
Hemoglobin: 9.6 g/dL — ABNORMAL LOW (ref 12.0–15.0)
MCH: 29.8 pg (ref 26.0–34.0)
MCHC: 32.4 g/dL (ref 30.0–36.0)
MCV: 91.9 fL (ref 80.0–100.0)
Platelets: 238 10*3/uL (ref 150–400)
RBC: 3.22 MIL/uL — ABNORMAL LOW (ref 3.87–5.11)
RDW: 15.9 % — ABNORMAL HIGH (ref 11.5–15.5)
WBC: 11.9 10*3/uL — ABNORMAL HIGH (ref 4.0–10.5)
nRBC: 1.6 % — ABNORMAL HIGH (ref 0.0–0.2)

## 2023-03-17 LAB — COMPREHENSIVE METABOLIC PANEL
ALT: 10 U/L (ref 0–44)
AST: 17 U/L (ref 15–41)
Albumin: 2.6 g/dL — ABNORMAL LOW (ref 3.5–5.0)
Alkaline Phosphatase: 78 U/L (ref 38–126)
Anion gap: 6 (ref 5–15)
BUN: 9 mg/dL (ref 6–20)
CO2: 23 mmol/L (ref 22–32)
Calcium: 8.9 mg/dL (ref 8.9–10.3)
Chloride: 115 mmol/L — ABNORMAL HIGH (ref 98–111)
Creatinine, Ser: 0.83 mg/dL (ref 0.44–1.00)
GFR, Estimated: >60 mL/min (ref >60)
Glucose, Bld: 230 mg/dL — ABNORMAL HIGH (ref 70–99)
Potassium: 3.3 mmol/L — ABNORMAL LOW (ref 3.5–5.1)
Sodium: 144 mmol/L (ref 135–145)
Total Bilirubin: 0.6 mg/dL (ref 0.3–1.2)
Total Protein: 5.3 g/dL — ABNORMAL LOW (ref 6.5–8.1)

## 2023-03-17 LAB — HEMOGLOBIN A1C
Hgb A1c MFr Bld: >15.5 % — ABNORMAL HIGH (ref 4.8–5.6)
Mean Plasma Glucose: >398 mg/dL

## 2023-03-17 LAB — GLUCOSE, CAPILLARY
Glucose-Capillary: 213 mg/dL — ABNORMAL HIGH (ref 70–99)
Glucose-Capillary: 188 mg/dL — ABNORMAL HIGH (ref 70–99)
Glucose-Capillary: 278 mg/dL — ABNORMAL HIGH (ref 70–99)
Glucose-Capillary: 261 mg/dL — ABNORMAL HIGH (ref 70–99)
Glucose-Capillary: 283 mg/dL — ABNORMAL HIGH (ref 70–99)
Glucose-Capillary: 255 mg/dL — ABNORMAL HIGH (ref 70–99)

## 2023-03-17 LAB — CULTURE, BLOOD (ROUTINE X 2)

## 2023-03-17 MED ORDER — POTASSIUM CHLORIDE CRYS ER 20 MEQ PO TBCR
40.0000 meq | EXTENDED_RELEASE_TABLET | Freq: Two times a day (BID) | ORAL | Status: AC
  Administered 2023-03-17 (×2): 40 meq via ORAL
  Filled 2023-03-17 (×2): qty 2

## 2023-03-17 MED ORDER — SODIUM CHLORIDE 0.9 % IV SOLN
8.0000 mg | Freq: Three times a day (TID) | INTRAVENOUS | Status: DC | PRN
  Administered 2023-03-17 – 2023-03-18 (×3): 8 mg via INTRAVENOUS
  Filled 2023-03-17 (×3): qty 4

## 2023-03-17 MED ORDER — AMLODIPINE BESYLATE 10 MG PO TABS
10.0000 mg | ORAL_TABLET | Freq: Every day | ORAL | Status: DC
  Administered 2023-03-17 – 2023-03-19 (×3): 10 mg via ORAL
  Filled 2023-03-17 (×3): qty 1

## 2023-03-17 MED ORDER — ENSURE MAX PROTEIN PO LIQD
11.0000 [oz_av] | Freq: Two times a day (BID) | ORAL | Status: DC
  Administered 2023-03-17: 11 [oz_av] via ORAL
  Filled 2023-03-17: qty 330

## 2023-03-17 MED ORDER — IRBESARTAN 75 MG PO TABS
37.5000 mg | ORAL_TABLET | Freq: Every day | ORAL | Status: DC
  Administered 2023-03-17 – 2023-03-19 (×3): 37.5 mg via ORAL
  Filled 2023-03-17 (×3): qty 0.5

## 2023-03-17 MED ORDER — METOPROLOL SUCCINATE ER 50 MG PO TB24
100.0000 mg | ORAL_TABLET | Freq: Every day | ORAL | Status: DC
  Administered 2023-03-17 – 2023-03-19 (×3): 100 mg via ORAL
  Filled 2023-03-17 (×3): qty 2

## 2023-03-17 MED ORDER — ADULT MULTIVITAMIN W/MINERALS CH
1.0000 | ORAL_TABLET | Freq: Every day | ORAL | Status: DC
  Administered 2023-03-18 – 2023-03-19 (×2): 1 via ORAL
  Filled 2023-03-17 (×2): qty 1

## 2023-03-17 NOTE — Evaluation (Signed)
Occupational Therapy Evaluation Patient Details Name: Regina Barry MRN: [id number] DOB: May 18, 1961 Today's Date: 03/17/2023   History of Present Illness Pt is a 62 yo female that presented for lethargy, confusion, somnolence. Workup showed DKA. PMH of pancreatic acinar cell cancer, s/p distal pancreectomy/splenectomy, HTN   Clinical Impression   Regina Barry was seen for OT evaluation this date. Prior to hospital admission, pt was IND. Pt lives with daughter (works) and 85yo mother (available 24/7). Upon arrival pt completing PT session. Pt requires increased time for responses. Pt currently requires SETUP self-feeding in sitting. MOD I for LB access seated in chair using figure 4. CGA + RW sit<>stand, step by step cues for RW technique. Pt would benefit from skilled OT to address noted impairments and functional limitations (see below for any additional details). Upon hospital discharge, recommend follow up therapy.   Recommendations for follow up therapy are one component of a multi-disciplinary discharge planning process, led by the attending physician.  Recommendations may be updated based on patient status, additional functional criteria and insurance authorization.   Assistance Recommended at Discharge Intermittent Supervision/Assistance  Patient can return home with the following A little help with walking and/or transfers;A little help with bathing/dressing/bathroom;Help with stairs or ramp for entrance    Functional Status Assessment  Patient has had a recent decline in their functional status and demonstrates the ability to make significant improvements in function in a reasonable and predictable amount of time.  Equipment Recommendations  None recommended by OT    Recommendations for Other Services       Precautions / Restrictions Precautions Precautions: Fall Restrictions Weight Bearing Restrictions: No      Mobility Bed Mobility               General bed mobility  comments: not tested    Transfers Overall transfer level: Needs assistance Equipment used: Rolling walker (2 wheels) Transfers: Sit to/from Stand Sit to Stand: Min guard           General transfer comment: cues for hand placement      Balance Overall balance assessment: Needs assistance Sitting-balance support: No upper extremity supported, Feet supported Sitting balance-Leahy Scale: Good     Standing balance support: Bilateral upper extremity supported Standing balance-Leahy Scale: Fair                             ADL either performed or assessed with clinical judgement   ADL Overall ADL's : Needs assistance/impaired                                       General ADL Comments: SETUP self-feeding in sitting. MOD I for LB access seated in chair in figure 4. MIN A + RW for simulated toilet t/f      Pertinent Vitals/Pain Pain Assessment Pain Assessment: Faces Faces Pain Scale: No hurt     Hand Dominance     Extremity/Trunk Assessment Upper Extremity Assessment Upper Extremity Assessment: Generalized weakness   Lower Extremity Assessment Lower Extremity Assessment: Generalized weakness       Communication Communication Communication: No difficulties   Cognition Arousal/Alertness: Awake/alert Behavior During Therapy: Flat affect, WFL for tasks assessed/performed Overall Cognitive Status: Impaired/Different from baseline Area of Impairment: Problem solving  Problem Solving: Decreased initiation, Slow processing General Comments: delayed responses, increased time to follow directions      Home Living Family/patient expects to be discharged to:: Private residence Living Arrangements: Parent;Children Available Help at Discharge: Family;Available 24 hours/day (40 yo mother available 24/7, daughter works) Type of Home: House Home Access: Stairs to enter Secretary/administrator of Steps:  5 Entrance Stairs-Rails: Right Home Layout: One level     Bathroom Shower/Tub: Dietitian: None          Prior Functioning/Environment Prior Level of Function : Independent/Modified Independent;Driving                        OT Problem List: Decreased strength;Decreased range of motion;Decreased activity tolerance;Impaired balance (sitting and/or standing);Decreased cognition;Decreased safety awareness      OT Treatment/Interventions: Self-care/ADL training;Therapeutic exercise;Energy conservation;DME and/or AE instruction;Therapeutic activities;Cognitive remediation/compensation;Balance training;Patient/family education    OT Goals(Current goals can be found in the care plan section) Acute Rehab OT Goals Patient Stated Goal: to go home OT Goal Formulation: With patient/family Time For Goal Achievement: 03/31/23 Potential to Achieve Goals: Good ADL Goals Pt Will Perform Grooming: Independently;standing Pt Will Perform Lower Body Dressing: Independently;sit to/from stand Pt Will Transfer to Toilet: Independently;ambulating;regular height toilet  OT Frequency: Min 2X/week    Co-evaluation              AM-PAC OT "6 Clicks" Daily Activity     Outcome Measure Help from another person eating meals?: None Help from another person taking care of personal grooming?: A Little Help from another person toileting, which includes using toliet, bedpan, or urinal?: A Little Help from another person bathing (including washing, rinsing, drying)?: A Little Help from another person to put on and taking off regular upper body clothing?: None Help from another person to put on and taking off regular lower body clothing?: A Little 6 Click Score: 20   End of Session Equipment Utilized During Treatment: Rolling walker (2 wheels)  Activity Tolerance: Patient tolerated treatment well Patient left: in chair;with call bell/phone within reach;with  family/visitor present  OT Visit Diagnosis: Other abnormalities of gait and mobility (R26.89);Muscle weakness (generalized) (M62.81)                Time: 1610-9604 OT Time Calculation (min): 10 min Charges:  OT General Charges $OT Visit: 1 Visit OT Evaluation $OT Eval Low Complexity: 1 Low  Kathie Dike, M.S. OTR/L  03/17/23, 2:26 PM  ascom [phone]

## 2023-03-17 NOTE — Inpatient Diabetes Management (Addendum)
Inpatient Diabetes Program Recommendations  AACE/ADA: New Consensus Statement on Inpatient Glycemic Control (2015)  Target Ranges:  Prepandial:   less than 140 mg/dL      Peak postprandial:   less than 180 mg/dL (1-2 hours)      Critically ill patients:  140 - 180 mg/dL    Latest Reference Range & Units 03/15/23 23:16 03/16/23 01:39 03/16/23 03:22 03/16/23 07:29 03/16/23 11:17 03/16/23 15:39 03/16/23 19:08  Glucose-Capillary 70 - 99 mg/dL 161 (H)  8 units Novolog 251 (H) 226 (H)  5 units Novolog  181 (H)  3 units Novolog 228 (H)  5 units Novolog  15 units Semglee @1036  265 (H)  8 units Novolog 201 (H)  5 units Novolog  (H): Data is abnormally high  Latest Reference Range & Units 03/16/23 23:34 03/17/23 03:21 03/17/23 07:42  Glucose-Capillary 70 - 99 mg/dL 096 (H)  5 units Novolog  213 (H)  5 units Novolog  188 (H)  3 units Novolog   (H): Data is abnormally high   Home DM Meds:  Lantus 25 units QHS       Humalog 10 units TID with meals plus 0-10 units for correction TID       Metformin 1000 mg BID (per [facility] notes)      Current Orders: Semglee 15 units Daily                            Novolog Moderate Correction Scale/ SSI (0-15 units) Q4 hours     MD- Please consider increasing Semglee to 20 units Daily    Addendum 11:30am--Met w/ pt at bedside.  No family present.  Pt was drowsy and had a hard time keeping her eyes open (pt got Dilaudid at 10:30am).  Kept conversation brief and will try to re-visit Monday 6/17 if still in hospital.  Briefly reviewed with pt admission diagnosis of DKA, treatment of DKA, and how the Dexamethasone she has been taking on cancer treatment days may have been the cause of the DKA.  Encouraged pt to follow up with ENDO regarding insulin adjustments when she takes the Decadron.       --Will follow patient during hospitalization--  Ambrose Finland RN, MSN, CDCES Diabetes Coordinator Inpatient Glycemic  Control Team Team Pager: [phone] (8a-5p)

## 2023-03-17 NOTE — Plan of Care (Signed)
Continuing with plan of care. 

## 2023-03-17 NOTE — Progress Notes (Signed)
Patient transferred to floor in wheelchair, with cardiac monitoring, with all belongings, and in stable condition.

## 2023-03-17 NOTE — Evaluation (Signed)
Physical Therapy Evaluation Patient Details Name: Regina Barry MRN: [id number] DOB: 03-29-1961 Today's Date: 03/17/2023  History of Present Illness  Pt is a 62 yo female that presented for lethargy, confusion, somnolence. Workup showed DKA. PMH of pancreatic acinar cell cancer, s/p distal pancreectomy/splenectomy, HTN.   Clinical Impression  Patient agreeable to PT, oriented to self, place, month/year. Per pt at baseline she is independent at baseline, lives with family. She was able to sit EOB with CGA, use of bed rails. Good sitting balance noted. Sit <> stand from EOB and from recliner CGA with RW. With encouragement she was able to ambulate ~61ft with close chair follow. Noted for decreased step length/height bilaterally, fatigued quickly.  Overall the patient demonstrated deficits (see "PT Problem List") that impede the patient's functional abilities, safety, and mobility and would benefit from skilled PT intervention. Recommendation is to continue skilled PT intervention to return pt to PLOF.        Recommendations for follow up therapy are one component of a multi-disciplinary discharge planning process, led by the attending physician.  Recommendations may be updated based on patient status, additional functional criteria and insurance authorization.  Follow Up Recommendations       Assistance Recommended at Discharge Frequent or constant Supervision/Assistance  Patient can return home with the following  A little help with walking and/or transfers;Assistance with cooking/housework;Assist for transportation;Help with stairs or ramp for entrance;A little help with bathing/dressing/bathroom    Equipment Recommendations Rolling walker (2 wheels);BSC/3in1  Recommendations for Other Services       Functional Status Assessment Patient has had a recent decline in their functional status and demonstrates the ability to make significant improvements in function in a reasonable and  predictable amount of time.     Precautions / Restrictions Precautions Precautions: Fall Restrictions Weight Bearing Restrictions: No      Mobility  Bed Mobility Overal bed mobility: Needs Assistance Bed Mobility: Supine to Sit     Supine to sit: Min guard, HOB elevated          Transfers Overall transfer level: Needs assistance Equipment used: Rolling walker (2 wheels) Transfers: Sit to/from Stand Sit to Stand: Min guard                Ambulation/Gait Ambulation/Gait assistance: Min guard Gait Distance (Feet): 20 Feet Assistive device: Rolling walker (2 wheels)         General Gait Details: very decreased gait velocity, decreased step length/height  Stairs            Wheelchair Mobility    Modified Rankin (Stroke Patients Only)       Balance Overall balance assessment: Needs assistance Sitting-balance support: No upper extremity supported, Feet supported Sitting balance-Leahy Scale: Good     Standing balance support: Bilateral upper extremity supported Standing balance-Leahy Scale: Fair                               Pertinent Vitals/Pain Pain Assessment Pain Assessment: Faces Faces Pain Scale: No hurt    Home Living Family/patient expects to be discharged to:: Private residence Living Arrangements: Parent;Children Available Help at Discharge: Family;Available 24 hours/day (77 yo mother available 24/7, daughter works) Type of Home: House Home Access: Stairs to enter Entrance Stairs-Rails: Right Entrance Stairs-Number of Steps: 5   Home Layout: One level Home Equipment: None      Prior Function Prior Level of Function : Independent/Modified Independent;Driving  Hand Dominance        Extremity/Trunk Assessment   Upper Extremity Assessment Upper Extremity Assessment: Generalized weakness    Lower Extremity Assessment Lower Extremity Assessment: Generalized weakness        Communication   Communication: No difficulties  Cognition Arousal/Alertness: Awake/alert Behavior During Therapy: Flat affect, WFL for tasks assessed/performed Overall Cognitive Status: Impaired/Different from baseline Area of Impairment: Problem solving                             Problem Solving: Decreased initiation, Slow processing General Comments: delayed responses, increased time to follow directions        General Comments      Exercises     Assessment/Plan    PT Assessment Patient needs continued PT services  PT Problem List Decreased strength;Decreased mobility;Decreased activity tolerance;Decreased balance;Decreased knowledge of use of DME       PT Treatment Interventions DME instruction;Therapeutic activities;Gait training;Therapeutic exercise;Stair training;Balance training;Functional mobility training;Neuromuscular re-education;Patient/family education    PT Goals (Current goals can be found in the Care Plan section)  Acute Rehab PT Goals Patient Stated Goal: to get back to PLOF PT Goal Formulation: With patient Time For Goal Achievement: 03/31/23 Potential to Achieve Goals: Good    Frequency Min 3X/week     Co-evaluation               AM-PAC PT "6 Clicks" Mobility  Outcome Measure Help needed turning from your back to your side while in a flat bed without using bedrails?: None Help needed moving from lying on your back to sitting on the side of a flat bed without using bedrails?: None Help needed moving to and from a bed to a chair (including a wheelchair)?: None Help needed standing up from a chair using your arms (e.g., wheelchair or bedside chair)?: A Little Help needed to walk in hospital room?: A Little Help needed climbing 3-5 steps with a railing? : A Lot 6 Click Score: 20    End of Session Equipment Utilized During Treatment: Gait belt Activity Tolerance: Patient tolerated treatment well Patient left: with call  bell/phone within reach;in chair Nurse Communication: Mobility status PT Visit Diagnosis: Other abnormalities of gait and mobility (R26.89);Muscle weakness (generalized) (M62.81)    Time: 1610-9604 PT Time Calculation (min) (ACUTE ONLY): 15 min   Charges:   PT Evaluation $PT Eval Low Complexity: 1 Low PT Treatments $Therapeutic Activity: 8-22 mins        Olga Coaster PT, DPT 2:50 PM,03/17/23

## 2023-03-17 NOTE — Progress Notes (Signed)
PROGRESS NOTE    Regina Barry  WUJ:[id number] DOB: December 16, 1960 DOA: 03/14/2023 PCP: Cathie Hoops, PA   Assessment & Plan:   Principal Problem:   Diabetic ketoacidosis with coma associated with type 2 diabetes mellitus (HCC) Active Problems:   Lactic acidosis   AKI (acute kidney injury) (HCC)   Acinar cell carcinoma of pancreas (HCC)   Essential hypertension   Obesity, Class III, BMI 40-49.9 (morbid obesity) (HCC)   Altered mental status  Assessment and Plan: DKA: w/ DM2. Resolved  DM2: poorly controlled, HbA1c > 15.5. Continue on glargine, SSI w/ accuchecks   Acute metabolic encephalopathy: likely secondary to DKA vs hypernatremia. Was only drinking sweat tea at home x 2 weeks as per pt's family member at beside. CT head shows no acute intracranial abnormalities. MRI brain shows no acute intracranial findings. Mental status is back to baseline   Hypernatremia: resolved  Abd pain: likely secondary to pancreatic cancer. CT abd/pelvis showed shows no acute or focal lesion to explain pt's symptoms. Much improved    AKI: resolved   Vaginal yeast infection: continue on IV fluconazle   Urinary retention: s/p I&O x cath x 2. Foley placed 03/16/23 and will do a voiding trial 03/17/23   Lactic acidosis: trending down. Continue on IVFs    Obesity: BMI 30.5. Complicates overall care   HTN: restarted home dose of amlodipine, irbesartan/hospital substitute, metoprolol. 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 [facility] onco at [facility]. Last chemo 03/07/23. Onco recs apprec   Normocytic anemia: likely secondary to recent chemo. Will transfuse if Hb < 7.0   Hypokalemia: potassium given      DVT prophylaxis: lovenox  Code Status: full  Family Communication: discussed pt's care w/ pt's daughter, Ellie Lunch, and answered her questions  Disposition Plan: depends on PT/OT recs   Level of care: Telemetry Medical Status is:  Inpatient Remains inpatient appropriate because: severity of illness   Consultants:    Procedures:   Antimicrobials:  Subjective: Pt c/o fatigue   Objective: Vitals:   03/17/23 0400 03/17/23 0700 03/17/23 0800 03/17/23 0828  BP: (!) 161/80   (!) 177/92  Pulse: 97 86 91 89  Resp: (!) 22 18 18 17   Temp:    98.4 F (36.9 C)  TempSrc:    Oral  SpO2: 97% 95% 97% 98%  Weight:      Height:        Intake/Output Summary (Last 24 hours) at 03/17/2023 0901 Last data filed at 03/17/2023 0400 Gross per 24 hour  Intake 1811.48 ml  Output 525 ml  Net 1286.48 ml   Filed Weights   03/14/23 1835 03/15/23 0330  Weight: 96 kg 80.7 kg    Examination:  General exam: Appears calm, comfortable  Respiratory system: decreased breath sounds b/l Cardiovascular system: S1 & S2+. No rubs or gallops Gastrointestinal system: Abd is soft, NT, obese & normal bowel sounds Central nervous system: Alert & oriented. Moves all extremities Psychiatry: judgement and insight appears close to baseline      Data Reviewed: I have personally reviewed following labs and imaging studies  CBC: Recent Labs  Lab 03/14/23 2017 03/16/23 0405 03/17/23 0552  WBC 8.7 12.1* 11.9*  NEUTROABS 6.5  --   --   HGB 14.2 10.9* 9.6*  HCT 47.8* 33.5* 29.6*  MCV 97.6 91.0 91.9  PLT 426* 305 238   Basic Metabolic Panel: Recent Labs  Lab 03/15/23 0808 03/15/23 1151  03/15/23 1610 03/16/23 0405 03/16/23 1300 03/16/23 1820 03/17/23 0552  NA 153* 153* 153* 153* 148* 148* 144  K 3.4* 3.6 3.1* 3.9  --   --  3.3*  CL 120* 122* 121* 123*  --   --  115*  CO2 23 21* 23 22  --   --  23  GLUCOSE 174* 183* 172* 246*  --   --  230*  BUN 30* 30* 24* 19  --   --  9  CREATININE 1.19* 1.26* 1.14* 1.09*  --   --  0.83  CALCIUM 8.3* 9.1 7.9* 9.7  --   --  8.9   GFR: Estimated Creatinine Clearance: 75 mL/min (by C-G formula based on SCr of 0.83 mg/dL). Liver Function Tests: Recent Labs  Lab 03/14/23 2017  03/16/23 0405 03/17/23 0552  AST 17 17 17   ALT 14 9 10   ALKPHOS 148* 86 78  BILITOT 1.7* 0.3 0.6  PROT 8.4* 5.9* 5.3*  ALBUMIN 4.2 2.8* 2.6*   Recent Labs  Lab 03/15/23 1610  LIPASE 331*   No results for input(s): "AMMONIA" in the last 168 hours. Coagulation Profile: Recent Labs  Lab 03/14/23 2017  INR 1.2   Cardiac Enzymes: No results for input(s): "CKTOTAL", "CKMB", "CKMBINDEX", "TROPONINI" in the last 168 hours. BNP (last 3 results) No results for input(s): "PROBNP" in the last 8760 hours. HbA1C: No results for input(s): "HGBA1C" in the last 72 hours. CBG: Recent Labs  Lab 03/16/23 1539 03/16/23 1908 03/16/23 2334 03/17/23 0321 03/17/23 0742  GLUCAP 265* 201* 212* 213* 188*   Lipid Profile: No results for input(s): "CHOL", "HDL", "LDLCALC", "TRIG", "CHOLHDL", "LDLDIRECT" in the last 72 hours. Thyroid Function Tests: No results for input(s): "TSH", "T4TOTAL", "FREET4", "T3FREE", "THYROIDAB" in the last 72 hours. Anemia Panel: No results for input(s): "VITAMINB12", "FOLATE", "FERRITIN", "TIBC", "IRON", "RETICCTPCT" in the last 72 hours. Sepsis Labs: Recent Labs  Lab 03/14/23 2017 03/14/23 2157 03/16/23 1341  LATICACIDVEN 4.2* 3.4* 2.0*    Recent Results (from the past 240 hour(s))  Blood culture (routine x 2)     Status: None (Preliminary result)   Collection Time: 03/14/23  8:17 PM   Specimen: BLOOD  Result Value Ref Range Status   Specimen Description BLOOD BLOOD LEFT ARM  Final   Special Requests   Final    BOTTLES DRAWN AEROBIC AND ANAEROBIC Blood Culture adequate volume   Culture   Final    NO GROWTH 3 DAYS Performed at [facility], [address] [place], [place] [address]    Report Status PENDING  Incomplete  Blood culture (routine x 2)     Status: None (Preliminary result)   Collection Time: 03/14/23  9:22 PM   Specimen: BLOOD  Result Value Ref Range Status   Specimen Description BLOOD port  Final   Special Requests   Final     BOTTLES DRAWN AEROBIC AND ANAEROBIC Blood Culture adequate volume   Culture   Final    NO GROWTH 3 DAYS Performed at [facility], [address] [place], [place] [address]    Report Status PENDING  Incomplete  Urine Culture     Status: None   Collection Time: 03/14/23  9:22 PM   Specimen: Urine, Random  Result Value Ref Range Status   Specimen Description   Final    URINE, RANDOM Performed at [facility], [address] [place], [place] [address]    Special Requests   Final    NONE  Performed at [facility], [address], [place], [place] [address]    Culture   Final    NO GROWTH Performed at [facility], [address], [place], [place] [address]    Report Status 03/16/2023 FINAL  Final  MRSA Next Gen by PCR, Nasal     Status: None   Collection Time: 03/15/23  3:47 AM   Specimen: Nasal Mucosa; Nasal Swab  Result Value Ref Range Status   MRSA by PCR Next Gen NOT DETECTED NOT DETECTED Final    Comment: (NOTE) The GeneXpert MRSA Assay (FDA approved for NASAL specimens only), is one component of a comprehensive MRSA colonization surveillance program. It is not intended to diagnose MRSA infection nor to guide or monitor treatment for MRSA infections. Test performance is not FDA approved in patients less than 63 years old. Performed at [facility], [address], [place], [place] [address]          Radiology Studies: CT ABDOMEN PELVIS WO CONTRAST  Result Date: 03/16/2023 CLINICAL DATA:  Abdominal pain, acute, nonlocalized. Question renal/ureteral stone. Personal history of prostate cancer. EXAM: CT ABDOMEN AND PELVIS WITHOUT CONTRAST TECHNIQUE: Multidetector CT imaging of the abdomen and pelvis was performed following the standard protocol without IV contrast. RADIATION DOSE REDUCTION: This exam was performed according to the departmental dose-optimization program which includes automated exposure control, adjustment  of the mA and/or kV according to patient size and/or use of iterative reconstruction technique. COMPARISON:  CT of the abdomen and pelvis 06/09/2012. One-view abdominal radiograph 03/15/2023. FINDINGS: Lower chest: Linear airspace opacities are present at the left base. Minimal atelectasis is present at the right base. The heart size is normal. No significant pleural or pericardial effusion is present. Hepatobiliary: No focal liver abnormality is seen. No gallstones, gallbladder wall thickening, or biliary dilatation. The liver is somewhat distended. Pancreas: Partial resection of the pancreas scratched at partial resection of pancreatic tail noted. The remaining pancreas is within normal limits. Spleen: Splenectomy noted. A focus of splenic tissue is present along the inferior aspect of the posterior left hemidiaphragm. Adrenals/Urinary Tract: The adrenal glands are normal bilaterally. The kidneys are unremarkable. No stone or mass lesion is present. Ureters are normal bilaterally. The urinary bladder is normal. Stomach/Bowel: The stomach and duodenum are within normal limits. Small bowel is unremarkable. Terminal ileum is normal. The appendix is visualized and within normal limits. The ascending and transverse colon are normal. The descending and sigmoid colon are normal. Vascular/Lymphatic: Scattered atherosclerotic calcifications are present in the aorta and branch vessels. No aneurysm is present. No significant adenopathy is present. Reproductive: Uterus and bilateral adnexa are unremarkable. Other: No abdominal wall hernia or abnormality. No abdominopelvic ascites. Musculoskeletal: Grade 1 anterolisthesis at L4-5 measures 8 mm. Uncovering of the disc and bilateral facet spurring results in severe central canal stenosis at this level. Moderate foraminal narrowing is present bilaterally. Slightly less severe disc disease and central canal stenosis is present at L2-3 and L3-4. Rightward curvature of the lower  lumbar spine is noted. No focal osseous lesions are present. The hips are located and within normal limits. IMPRESSION: 1. No acute or focal lesion to explain the patient's symptoms. 2. 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 3. Grade 1 anterolisthesis at L4-5 with severe central canal stenosis and moderate foraminal narrowing bilaterally. 4. Slightly less severe disc disease and central canal stenosis at L2-3 and L3-4. 5.  Aortic Atherosclerosis (ICD10-I70.0). Electronically Signed   By: Marin Roberts  M.D.   On: 03/16/2023 16:12   MR BRAIN WO CONTRAST  Result Date: 03/15/2023 CLINICAL DATA:  Mental status change, unknown cause. EXAM: MRI HEAD WITHOUT CONTRAST TECHNIQUE: Multiplanar, multiecho pulse sequences of the brain and surrounding structures were obtained without intravenous contrast. COMPARISON:  Head CT 03/14/2023 FINDINGS: The study is mildly motion degraded despite utilizing motion resistant imaging protocols and repeat imaging. Brain: There is no evidence of an acute infarct, mass, midline shift, or extra-axial fluid collection. A few punctate foci of T2 FLAIR hyperintensity in the cerebral white matter are nonspecific but may reflect minimal chronic small vessel ischemic disease and are not considered abnormal for age. A single chronic microhemorrhage is suspected in the posterior left frontal lobe. There is a partially empty sella. There is mild generalized cerebral atrophy. Vascular: Major intracranial vascular flow voids are preserved. Skull and upper cervical spine: Unremarkable bone marrow signal para Sinuses/Orbits: Unremarkable orbits. Clear paranasal sinuses. No significant mastoid fluid. Other: None. IMPRESSION: No acute intracranial abnormality. Electronically Signed   By: Sebastian Ache M.D.   On: 03/15/2023 22:00   DG Abd Portable 1V  Result Date: 03/15/2023 CLINICAL DATA:  Abdominal pain EXAM: PORTABLE ABDOMEN - 1 VIEW COMPARISON:  03/28/2011 FINDINGS: Bowel  gas pattern is nonspecific. There is 1.3 cm calcific density in left paraspinal region at the L2 level in the course of left ureter. Kidneys are partly obscured by bowel contents. Phleboliths are seen in pelvis. IMPRESSION: Nonspecific bowel gas pattern. There is 1.3 cm calcific density in left paraspinal region in the proximal course of left ureter. If there is clinical suspicion for ureteric calculus, follow-up CT may be considered. Electronically Signed   By: Ernie Avena M.D.   On: 03/15/2023 13:42        Scheduled Meds:  amLODipine  10 mg Oral Daily   Chlorhexidine Gluconate Cloth  6 each Topical Daily   DULoxetine  30 mg Oral Daily   enoxaparin (LOVENOX) injection  40 mg Subcutaneous Q24H   insulin aspart  0-15 Units Subcutaneous Q4H   insulin glargine-yfgn  15 Units Subcutaneous Daily   irbesartan  37.5 mg Oral Daily   metoprolol succinate  100 mg Oral Daily   pantoprazole  40 mg Oral Daily   potassium chloride  40 mEq Oral BID   Continuous Infusions:  fluconazole (DIFLUCAN) IV Stopped (03/16/23 1721)   lactated ringers       LOS: 3 days    Time spent: 35 mins     Charise Killian, MD [facility] Pager [phone]  If 7PM-7AM, please contact night-coverage [email or web address] 03/17/2023, 9:01 AM

## 2023-03-17 NOTE — Progress Notes (Signed)
Initial Nutrition Assessment  DOCUMENTATION CODES:   Not applicable  INTERVENTION:   Ensure Max protein supplement po BID, each supplement provides 150kcal and 30g of protein.  MVI po daily   Pt at high refeed risk; recommend monitor potassium, magnesium and phosphorus labs daily until stable  Daily weights   NUTRITION DIAGNOSIS:   Increased nutrient needs related to cancer and cancer related treatments as evidenced by estimated needs.  GOAL:   Patient will meet greater than or equal to 90% of their needs  MONITOR:   PO intake, Supplement acceptance, Labs, Weight trends, Skin, I & O's  REASON FOR ASSESSMENT:   Malnutrition Screening Tool    ASSESSMENT:   62 y/o female with h/o anxiety, depression, HTN, HLD, DM, metastatic pancreatic cancer s/p exploratory laparotomy with distal pancreatectomy and splenectomy (2022), currrently on chemotherapy who is admitted with DKA and AKI.  Met with pt in room today. Pt reports decreased appetite and oral intake pta. Pt reports decreased oral intake at times when she has chemo treatments. Pt reports that her appetite is poor in hospital. Pt reports that she ate less than half of her breakfast this morning. Pt does not drink supplements at baseline but reports she is willing to try chocolate Ensure. RD discussed with pt the importance of adequate nutrition needed to preserve lean muscle. RD will add supplements and MVI to help pt meet her estimated needs. Pt is at high refeed risk.   Per chart, pt is down 22lbs(11%) over the past 6 months; this is significant weight loss.   Medications reviewed and include: lovenox, insulin, protonix, Kcl, diflucan   Labs reviewed: K 3.3(L) Wbc- 11.9(H), Hgb 9.6(L), Hct 29.6(L) Cbgs- 278, 188, 213 x 24 hrs  AIC >15.5(H)- 6/13  NUTRITION - FOCUSED PHYSICAL EXAM:  Flowsheet Row Most Recent Value  Orbital Region No depletion  Upper Arm Region No depletion  Thoracic and Lumbar Region No depletion   Buccal Region No depletion  Temple Region No depletion  Clavicle Bone Region Mild depletion  Clavicle and Acromion Bone Region Mild depletion  Scapular Bone Region No depletion  Dorsal Hand No depletion  Patellar Region Mild depletion  Anterior Thigh Region Mild depletion  Posterior Calf Region Mild depletion  Edema (RD Assessment) Mild  Hair Reviewed  Eyes Reviewed  Mouth Reviewed  Skin Reviewed  Nails Reviewed   Diet Order:   Diet Order             Diet Carb Modified Fluid consistency: Thin  Diet effective now                  EDUCATION NEEDS:   Education needs have been addressed  Skin:  Skin Assessment: Reviewed RN Assessment  Last BM:  6/14- TYPE 7  Height:   Ht Readings from Last 1 Encounters:  03/15/23 5\' 4"  (1.626 m)    Weight:   Wt Readings from Last 1 Encounters:  03/15/23 80.7 kg    Ideal Body Weight:  54.5 kg  BMI:  Body mass index is 30.54 kg/m.  Estimated Nutritional Needs:   Kcal:  1800-2100kcal/day  Protein:  90-105g/day  Fluid:  1.7-1.9L/day  Betsey Holiday MS, RD, LDN Please refer to [facility] for RD and/or RD on-call/weekend/after hours pager

## 2023-03-17 NOTE — Progress Notes (Signed)
Called to give report to receiving nurse for patient, nurse to return call.

## 2023-03-17 NOTE — Progress Notes (Signed)
Report given to receiving nurse, Ebony on new unit.

## 2023-03-17 NOTE — Progress Notes (Signed)
This Chap responded to page and visited the Pt and family at the bedside. Pt requested support to fill out the Adv. Dir, Mirna Mires provided the forms as well as education on how to fill. Mirna Mires is available  as needed.   03/17/23 1400  Spiritual Encounters  Type of Visit Initial  Care provided to: Pt and family  Conversation partners present during encounter Nurse  Referral source Nurse (RN/NT/LPN)  Reason for visit Advance directives  OnCall Visit Yes  Spiritual Framework  Presenting Themes Courage hope and growth;Meaning/purpose/sources of inspiration  Community/Connection Family  Interventions  Spiritual Care Interventions Made Decision-making support/facilitation;Reflective listening  Intervention Outcomes  Outcomes Awareness of support;Connection to spiritual care  Spiritual Care Plan  Spiritual Care Issues Still Outstanding No further spiritual care needs at this time (see row info)

## 2023-03-17 NOTE — Progress Notes (Signed)
Indwelling foley catheter removed as ordered, patient tolerated well, and all parts intact.

## 2023-03-18 DIAGNOSIS — C252 Malignant neoplasm of tail of pancreas: Secondary | ICD-10-CM | POA: Diagnosis not present

## 2023-03-18 DIAGNOSIS — C259 Malignant neoplasm of pancreas, unspecified: Secondary | ICD-10-CM | POA: Diagnosis not present

## 2023-03-18 DIAGNOSIS — E1111 Type 2 diabetes mellitus with ketoacidosis with coma: Secondary | ICD-10-CM | POA: Diagnosis not present

## 2023-03-18 LAB — CBC
HCT: 28.6 % — ABNORMAL LOW (ref 36.0–46.0)
Hemoglobin: 9.3 g/dL — ABNORMAL LOW (ref 12.0–15.0)
MCH: 29.3 pg (ref 26.0–34.0)
MCHC: 32.5 g/dL (ref 30.0–36.0)
MCV: 90.2 fL (ref 80.0–100.0)
Platelets: 229 10*3/uL (ref 150–400)
RBC: 3.17 MIL/uL — ABNORMAL LOW (ref 3.87–5.11)
RDW: 15.6 % — ABNORMAL HIGH (ref 11.5–15.5)
WBC: 10.2 10*3/uL (ref 4.0–10.5)
nRBC: 1.1 % — ABNORMAL HIGH (ref 0.0–0.2)

## 2023-03-18 LAB — COMPREHENSIVE METABOLIC PANEL
ALT: 10 U/L (ref 0–44)
AST: 12 U/L — ABNORMAL LOW (ref 15–41)
Albumin: 2.5 g/dL — ABNORMAL LOW (ref 3.5–5.0)
Alkaline Phosphatase: 80 U/L (ref 38–126)
Anion gap: 7 (ref 5–15)
BUN: 8 mg/dL (ref 6–20)
CO2: 22 mmol/L (ref 22–32)
Calcium: 8.7 mg/dL — ABNORMAL LOW (ref 8.9–10.3)
Chloride: 113 mmol/L — ABNORMAL HIGH (ref 98–111)
Creatinine, Ser: 0.72 mg/dL (ref 0.44–1.00)
GFR, Estimated: >60 mL/min (ref >60)
Glucose, Bld: 137 mg/dL — ABNORMAL HIGH (ref 70–99)
Potassium: 3.4 mmol/L — ABNORMAL LOW (ref 3.5–5.1)
Sodium: 142 mmol/L (ref 135–145)
Total Bilirubin: 0.5 mg/dL (ref 0.3–1.2)
Total Protein: 5.4 g/dL — ABNORMAL LOW (ref 6.5–8.1)

## 2023-03-18 LAB — GLUCOSE, CAPILLARY
Glucose-Capillary: 117 mg/dL — ABNORMAL HIGH (ref 70–99)
Glucose-Capillary: 129 mg/dL — ABNORMAL HIGH (ref 70–99)
Glucose-Capillary: 154 mg/dL — ABNORMAL HIGH (ref 70–99)
Glucose-Capillary: 155 mg/dL — ABNORMAL HIGH (ref 70–99)
Glucose-Capillary: 187 mg/dL — ABNORMAL HIGH (ref 70–99)
Glucose-Capillary: 241 mg/dL — ABNORMAL HIGH (ref 70–99)
Glucose-Capillary: 246 mg/dL — ABNORMAL HIGH (ref 70–99)
Glucose-Capillary: 248 mg/dL — ABNORMAL HIGH (ref 70–99)

## 2023-03-18 LAB — CULTURE, BLOOD (ROUTINE X 2): Culture: NO GROWTH

## 2023-03-18 LAB — PHOSPHORUS: Phosphorus: 1.9 mg/dL — ABNORMAL LOW (ref 2.5–4.6)

## 2023-03-18 LAB — MAGNESIUM: Magnesium: 2.1 mg/dL (ref 1.7–2.4)

## 2023-03-18 MED ORDER — OXYCODONE-ACETAMINOPHEN 5-325 MG PO TABS
1.0000 | ORAL_TABLET | Freq: Four times a day (QID) | ORAL | Status: DC | PRN
  Administered 2023-03-18 – 2023-03-19 (×2): 1 via ORAL
  Filled 2023-03-18 (×2): qty 1

## 2023-03-18 MED ORDER — POTASSIUM CHLORIDE CRYS ER 20 MEQ PO TBCR
20.0000 meq | EXTENDED_RELEASE_TABLET | Freq: Once | ORAL | Status: AC
  Administered 2023-03-18: 20 meq via ORAL
  Filled 2023-03-18: qty 1

## 2023-03-18 MED ORDER — INSULIN ASPART 100 UNIT/ML IJ SOLN
0.0000 [IU] | Freq: Three times a day (TID) | INTRAMUSCULAR | Status: DC
  Administered 2023-03-18 (×3): 5 [IU] via SUBCUTANEOUS
  Administered 2023-03-19: 3 [IU] via SUBCUTANEOUS
  Administered 2023-03-19: 8 [IU] via SUBCUTANEOUS
  Filled 2023-03-18 (×5): qty 1

## 2023-03-18 MED ORDER — HYDROMORPHONE HCL 1 MG/ML IJ SOLN: 1.0000 mg | INTRAMUSCULAR | Status: DC | PRN

## 2023-03-18 MED ORDER — INSULIN ASPART 100 UNIT/ML IJ SOLN: 0.0000 [IU] | Freq: Three times a day (TID) | INTRAMUSCULAR | Status: DC

## 2023-03-18 MED ORDER — PHENOL 1.4 % MT LIQD
1.0000 | OROMUCOSAL | Status: DC | PRN
  Administered 2023-03-18: 1 via OROMUCOSAL
  Filled 2023-03-18: qty 177

## 2023-03-18 MED ORDER — SODIUM CHLORIDE 0.9 % IV SOLN
12.5000 mg | Freq: Four times a day (QID) | INTRAVENOUS | Status: DC | PRN
  Administered 2023-03-18: 12.5 mg via INTRAVENOUS
  Filled 2023-03-18: qty 12.5

## 2023-03-18 MED ORDER — ACETAMINOPHEN 500 MG PO TABS
1000.0000 mg | ORAL_TABLET | Freq: Four times a day (QID) | ORAL | Status: DC | PRN
  Administered 2023-03-18: 1000 mg via ORAL
  Filled 2023-03-18 (×2): qty 2

## 2023-03-18 MED ORDER — POTASSIUM PHOSPHATES 15 MMOLE/5ML IV SOLN
30.0000 mmol | Freq: Once | INTRAVENOUS | Status: AC
  Administered 2023-03-18: 30 mmol via INTRAVENOUS
  Filled 2023-03-18: qty 10

## 2023-03-18 NOTE — Progress Notes (Signed)
PROGRESS NOTE    Regina Barry  JXB:[id number] DOB: 25-Dec-1960 DOA: 03/14/2023 PCP: Cathie Hoops, PA   Assessment & Plan:   Principal Problem:   Diabetic ketoacidosis with coma associated with type 2 diabetes mellitus (HCC) Active Problems:   Lactic acidosis   AKI (acute kidney injury) (HCC)   Acinar cell carcinoma of pancreas (HCC)   Essential hypertension   Obesity, Class III, BMI 40-49.9 (morbid obesity) (HCC)   Altered mental status  Assessment and Plan: DKA: w/ DM2. Resolved  DM2: HbA1c >15.5, very poorly controlled. Continue on glargine, SSI w/ acucchecks. Zofran, phenergan prn   Acute metabolic encephalopathy: likely secondary to DKA vs hypernatremia. Was only drinking sweat tea at home x 2 weeks as per pt's family member at beside. CT head shows no acute intracranial abnormalities. MRI brain shows no acute intracranial findings. Mental status is back to baseline. Resolved   Hypernatremia: resolved  Abd pain: likely secondary to pancreatic cancer. CT abd/pelvis showed shows no acute or focal lesion to explain pt's symptoms. Much improved.    AKI: resolved   Vaginal yeast infection: completed fluconazole course  Urinary retention: s/p I&O x cath x 2. Foley placed 03/16/23 and will do a voiding trial 03/17/23. Resolved   Lactic acidosis: resolved    Obesity: BMI 31.3. Complicates overall care & prognosis   HTN: continue on home dose of amlodipine, ARB, metoprolol. 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 Sees onco at [facility]. Last chemo 03/07/23. Onco recs apprec   Normocytic anemia: likely secondary to recent chemo. No need for a transfusion currently   Hypokalemia: KCl repleated   Hypophosphatemia: potassium phosphate      DVT prophylaxis: lovenox  Code Status: full  Family Communication: discussed pt's care w/ pt's daughter, Clovis Riley, and answered her questions  Disposition Plan: likely d/c  home w/ home health    Level of care: Telemetry Medical Status is: Inpatient Remains inpatient appropriate because: severity of illness, still nauseous and requiring IV anti-nausea meds    Consultants:    Procedures:   Antimicrobials:  Subjective: Pt c/o nausea   Objective: Vitals:   03/18/23 0233 03/18/23 0453 03/18/23 0500 03/18/23 0629  BP: (!) 154/85 (!) 151/74  (!) 141/71  Pulse:  85  81  Resp:  18    Temp:  98.8 F (37.1 C)    TempSrc:  Oral    SpO2:  94%    Weight:   82.9 kg   Height:        Intake/Output Summary (Last 24 hours) at 03/18/2023 0737 Last data filed at 03/18/2023 0317 Gross per 24 hour  Intake 977.35 ml  Output 600 ml  Net 377.35 ml   Filed Weights   03/14/23 1835 03/15/23 0330 03/18/23 0500  Weight: 96 kg 80.7 kg 82.9 kg    Examination:  General exam: Appears calm but uncomfortable  Respiratory system: diminished breath sounds b/l  Cardiovascular system: S1/S2+. No rubs or clicks  Gastrointestinal system: abd is soft, NT, obese & hypoactive bowel sounds  Central nervous system: alert & oriented. Moves all extremities  Psychiatry: judgement and insight appears improved. Flat mood and affect      Data Reviewed: I have personally reviewed following labs and imaging studies  CBC: Recent Labs  Lab 03/14/23 2017 03/16/23 0405 03/17/23 0552 03/18/23 0451  WBC 8.7 12.1* 11.9* 10.2  NEUTROABS 6.5  --   --   --  HGB 14.2 10.9* 9.6* 9.3*  HCT 47.8* 33.5* 29.6* 28.6*  MCV 97.6 91.0 91.9 90.2  PLT 426* 305 238 229   Basic Metabolic Panel: Recent Labs  Lab 03/15/23 1151 03/15/23 1610 03/16/23 0405 03/16/23 1300 03/16/23 1820 03/17/23 0552 03/18/23 0451  NA 153* 153* 153* 148* 148* 144 142  K 3.6 3.1* 3.9  --   --  3.3* 3.4*  CL 122* 121* 123*  --   --  115* 113*  CO2 21* 23 22  --   --  23 22  GLUCOSE 183* 172* 246*  --   --  230* 137*  BUN 30* 24* 19  --   --  9 8  CREATININE 1.26* 1.14* 1.09*  --   --  0.83 0.72  CALCIUM  9.1 7.9* 9.7  --   --  8.9 8.7*  MG  --   --   --   --   --   --  2.1  PHOS  --   --   --   --   --   --  1.9*   GFR: Estimated Creatinine Clearance: 78.9 mL/min (by C-G formula based on SCr of 0.72 mg/dL). Liver Function Tests: Recent Labs  Lab 03/14/23 2017 03/16/23 0405 03/17/23 0552 03/18/23 0451  AST 17 17 17  12*  ALT 14 9 10 10   ALKPHOS 148* 86 78 80  BILITOT 1.7* 0.3 0.6 0.5  PROT 8.4* 5.9* 5.3* 5.4*  ALBUMIN 4.2 2.8* 2.6* 2.5*   Recent Labs  Lab 03/15/23 1610  LIPASE 331*   No results for input(s): "AMMONIA" in the last 168 hours. Coagulation Profile: Recent Labs  Lab 03/14/23 2017  INR 1.2   Cardiac Enzymes: No results for input(s): "CKTOTAL", "CKMB", "CKMBINDEX", "TROPONINI" in the last 168 hours. BNP (last 3 results) No results for input(s): "PROBNP" in the last 8760 hours. HbA1C: Recent Labs    03/16/23 0405  HGBA1C >15.5*   CBG: Recent Labs  Lab 03/17/23 2122 03/18/23 0001 03/18/23 0036 03/18/23 0451 03/18/23 0627  GLUCAP 255* 187* 154* 117* 129*   Lipid Profile: No results for input(s): "CHOL", "HDL", "LDLCALC", "TRIG", "CHOLHDL", "LDLDIRECT" in the last 72 hours. Thyroid Function Tests: No results for input(s): "TSH", "T4TOTAL", "FREET4", "T3FREE", "THYROIDAB" in the last 72 hours. Anemia Panel: No results for input(s): "VITAMINB12", "FOLATE", "FERRITIN", "TIBC", "IRON", "RETICCTPCT" in the last 72 hours. Sepsis Labs: Recent Labs  Lab 03/14/23 2017 03/14/23 2157 03/16/23 1341  LATICACIDVEN 4.2* 3.4* 2.0*    Recent Results (from the past 240 hour(s))  Blood culture (routine x 2)     Status: None (Preliminary result)   Collection Time: 03/14/23  8:17 PM   Specimen: BLOOD  Result Value Ref Range Status   Specimen Description BLOOD BLOOD LEFT ARM  Final   Special Requests   Final    BOTTLES DRAWN AEROBIC AND ANAEROBIC Blood Culture adequate volume   Culture   Final    NO GROWTH 4 DAYS Performed at [facility], [address] [place], [place] [address]    Report Status PENDING  Incomplete  Blood culture (routine x 2)     Status: None (Preliminary result)   Collection Time: 03/14/23  9:22 PM   Specimen: BLOOD  Result Value Ref Range Status   Specimen Description BLOOD port  Final   Special Requests   Final    BOTTLES DRAWN AEROBIC AND ANAEROBIC Blood Culture adequate volume   Culture   Final  NO GROWTH 4 DAYS Performed at [facility], [address] [place], [place] [address]    Report Status PENDING  Incomplete  Urine Culture     Status: None   Collection Time: 03/14/23  9:22 PM   Specimen: Urine, Random  Result Value Ref Range Status   Specimen Description   Final    URINE, RANDOM Performed at [facility], [address], [place], [place] [address]    Special Requests   Final    NONE Performed at [facility], [address], [place], [place] [address]    Culture   Final    NO GROWTH Performed at [facility], [address] [place], [place] [address]    Report Status 03/16/2023 FINAL  Final  MRSA Next Gen by PCR, Nasal     Status: None   Collection Time: 03/15/23  3:47 AM   Specimen: Nasal Mucosa; Nasal Swab  Result Value Ref Range Status   MRSA by PCR Next Gen NOT DETECTED NOT DETECTED Final    Comment: (NOTE) The GeneXpert MRSA Assay (FDA approved for NASAL specimens only), is one component of a comprehensive MRSA colonization surveillance program. It is not intended to diagnose MRSA infection nor to guide or monitor treatment for MRSA infections. Test performance is not FDA approved in patients less than 74 years old. Performed at [facility], [address] [place], [place] [address]          Radiology Studies: CT ABDOMEN PELVIS WO CONTRAST  Result Date: 03/16/2023 CLINICAL DATA:  Abdominal pain, acute, nonlocalized. Question renal/ureteral stone. Personal history of prostate cancer. EXAM: CT ABDOMEN AND PELVIS  WITHOUT CONTRAST TECHNIQUE: Multidetector CT imaging of the abdomen and pelvis was performed following the standard protocol without IV contrast. RADIATION DOSE REDUCTION: This exam was performed according to the departmental dose-optimization program which includes automated exposure control, adjustment of the mA and/or kV according to patient size and/or use of iterative reconstruction technique. COMPARISON:  CT of the abdomen and pelvis 06/09/2012. One-view abdominal radiograph 03/15/2023. FINDINGS: Lower chest: Linear airspace opacities are present at the left base. Minimal atelectasis is present at the right base. The heart size is normal. No significant pleural or pericardial effusion is present. Hepatobiliary: No focal liver abnormality is seen. No gallstones, gallbladder wall thickening, or biliary dilatation. The liver is somewhat distended. Pancreas: Partial resection of the pancreas scratched at partial resection of pancreatic tail noted. The remaining pancreas is within normal limits. Spleen: Splenectomy noted. A focus of splenic tissue is present along the inferior aspect of the posterior left hemidiaphragm. Adrenals/Urinary Tract: The adrenal glands are normal bilaterally. The kidneys are unremarkable. No stone or mass lesion is present. Ureters are normal bilaterally. The urinary bladder is normal. Stomach/Bowel: The stomach and duodenum are within normal limits. Small bowel is unremarkable. Terminal ileum is normal. The appendix is visualized and within normal limits. The ascending and transverse colon are normal. The descending and sigmoid colon are normal. Vascular/Lymphatic: Scattered atherosclerotic calcifications are present in the aorta and branch vessels. No aneurysm is present. No significant adenopathy is present. Reproductive: Uterus and bilateral adnexa are unremarkable. Other: No abdominal wall hernia or abnormality. No abdominopelvic ascites. Musculoskeletal: Grade 1 anterolisthesis at  L4-5 measures 8 mm. Uncovering of the disc and bilateral facet spurring results in severe central canal stenosis at this level. Moderate foraminal narrowing is present bilaterally. Slightly less severe disc disease and central canal stenosis is present at L2-3 and  L3-4. Rightward curvature of the lower lumbar spine is noted. No focal osseous lesions are present. The hips are located and within normal limits. IMPRESSION: 1. No acute or focal lesion to explain the patient's symptoms. 2. 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 3. Grade 1 anterolisthesis at L4-5 with severe central canal stenosis and moderate foraminal narrowing bilaterally. 4. Slightly less severe disc disease and central canal stenosis at L2-3 and L3-4. 5.  Aortic Atherosclerosis (ICD10-I70.0). Electronically Signed   By: Marin Roberts M.D.   On: 03/16/2023 16:12        Scheduled Meds:  amLODipine  10 mg Oral Daily   Chlorhexidine Gluconate Cloth  6 each Topical Daily   DULoxetine  30 mg Oral Daily   enoxaparin (LOVENOX) injection  40 mg Subcutaneous Q24H   insulin aspart  0-15 Units Subcutaneous Q4H   insulin glargine-yfgn  15 Units Subcutaneous Daily   irbesartan  37.5 mg Oral Daily   metoprolol succinate  100 mg Oral Daily   multivitamin with minerals  1 tablet Oral Daily   pantoprazole  40 mg Oral Daily   potassium chloride  20 mEq Oral Once   Ensure Max Protein  11 oz Oral BID   Continuous Infusions:  ondansetron (ZOFRAN) IV 8 mg (03/18/23 0657)     LOS: 4 days    Time spent: 25 mins     Charise Killian, MD [facility] Pager [phone]  If 7PM-7AM, please contact night-coverage [email or web address] 03/18/2023, 7:37 AM

## 2023-03-18 NOTE — Progress Notes (Signed)
Mobility Specialist - Progress Note   03/18/23 0943  Mobility  Activity Ambulated with assistance in hallway;Stood at bedside;Dangled on edge of bed  Level of Assistance Standby assist, set-up cues, supervision of patient - no hands on  Assistive Device Front wheel walker  Distance Ambulated (ft) 180 ft  Activity Response Tolerated well  Mobility Referral Yes  $Mobility charge 1 Mobility  Mobility Specialist Start Time (ACUTE ONLY) 0859  Mobility Specialist Stop Time (ACUTE ONLY) 0913  Mobility Specialist Time Calculation (min) (ACUTE ONLY) 14 min   Pt supine in bed on RA upon arrival. Pt completes bed mobility, STS, and ambulates in hallway SBA with no LOB. Pt takes one standing rest break and endorses dizziness with the last 80 ft of ambulation. RN notified. Pt left EOB with needs in reach and bed alarm activated.   Terrilyn Saver  Mobility Specialist  03/18/23 9:45 AM

## 2023-03-18 NOTE — TOC Progression Note (Signed)
Transition of Care [facility]) - Progression Note    Patient Details  Name: Regina Barry MRN: [id number] Date of Birth: 25-Mar-1961  Transition of Care [facility]) CM/SW Contact  Allena Katz, LCSW Phone Number: 03/18/2023, 10:24 AM  Clinical Narrative:     CSW attempted to speak with pt about HH and DME needs. Nursing in with patient. CSW will check back in with patient later in admission to speak with her about HH.        Expected Discharge Plan and Services                                               Social Determinants of Health (SDOH) Interventions SDOH Screenings   Food Insecurity: No Food Insecurity (03/16/2023)  Housing: Low Risk  (03/16/2023)  Transportation Needs: No Transportation Needs (03/16/2023)  Utilities: Not At Risk (03/16/2023)  Tobacco Use: Low Risk  (03/14/2023)    Readmission Risk Interventions     No data to display

## 2023-03-18 NOTE — Progress Notes (Signed)
PT Cancellation Note  Patient Details Name: Regina Barry MRN: [id number] DOB: 27-Dec-1960   Cancelled Treatment:     PT attempt. 2nd attempt today. Pt currently in BR with daughter assisting. Requested author return at a later time. Pt is planning to DC home tomorrow. Author will have PT check on pt prior to DC.    Rushie Chestnut 03/18/2023, 2:40 PM

## 2023-03-18 NOTE — Plan of Care (Signed)
  Problem: Education: Goal: Ability to describe self-care measures that may prevent or decrease complications (Diabetes Survival Skills Education) will improve Outcome: Progressing   Problem: Coping: Goal: Ability to adjust to condition or change in health will improve Outcome: Progressing   Problem: Fluid Volume: Goal: Ability to maintain a balanced intake and output will improve Outcome: Progressing   Problem: Health Behavior/Discharge Planning: Goal: Ability to identify and utilize available resources and services will improve Outcome: Progressing Goal: Ability to manage health-related needs will improve Outcome: Progressing   Problem: Metabolic: Goal: Ability to maintain appropriate glucose levels will improve Outcome: Progressing   Problem: Nutritional: Goal: Maintenance of adequate nutrition will improve Outcome: Progressing Goal: Progress toward achieving an optimal weight will improve Outcome: Progressing   Problem: Skin Integrity: Goal: Risk for impaired skin integrity will decrease Outcome: Progressing   Problem: Tissue Perfusion: Goal: Adequacy of tissue perfusion will improve Outcome: Progressing   Problem: Education: Goal: Ability to describe self-care measures that may prevent or decrease complications (Diabetes Survival Skills Education) will improve Outcome: Progressing   Problem: Cardiac: Goal: Ability to maintain an adequate cardiac output will improve Outcome: Progressing   Problem: Health Behavior/Discharge Planning: Goal: Ability to identify and utilize available resources and services will improve Outcome: Progressing Goal: Ability to manage health-related needs will improve Outcome: Progressing   Problem: Fluid Volume: Goal: Ability to achieve a balanced intake and output will improve Outcome: Progressing   Problem: Metabolic: Goal: Ability to maintain appropriate glucose levels will improve Outcome: Progressing   Problem:  Nutritional: Goal: Maintenance of adequate nutrition will improve Outcome: Progressing Goal: Maintenance of adequate weight for body size and type will improve Outcome: Progressing   Problem: Respiratory: Goal: Will regain and/or maintain adequate ventilation Outcome: Progressing   Problem: Urinary Elimination: Goal: Ability to achieve and maintain adequate renal perfusion and functioning will improve Outcome: Progressing   Problem: Education: Goal: Knowledge of General Education information will improve Description: Including pain rating scale, medication(s)/side effects and non-pharmacologic comfort measures Outcome: Progressing   Problem: Health Behavior/Discharge Planning: Goal: Ability to manage health-related needs will improve Outcome: Progressing   Problem: Clinical Measurements: Goal: Ability to maintain clinical measurements within normal limits will improve Outcome: Progressing Goal: Will remain free from infection Outcome: Progressing Goal: Diagnostic test results will improve Outcome: Progressing Goal: Respiratory complications will improve Outcome: Progressing Goal: Cardiovascular complication will be avoided Outcome: Progressing   Problem: Activity: Goal: Risk for activity intolerance will decrease Outcome: Progressing   Problem: Nutrition: Goal: Adequate nutrition will be maintained Outcome: Progressing   Problem: Coping: Goal: Level of anxiety will decrease Outcome: Progressing   Problem: Elimination: Goal: Will not experience complications related to bowel motility Outcome: Progressing Goal: Will not experience complications related to urinary retention Outcome: Progressing   Problem: Pain Managment: Goal: General experience of comfort will improve Outcome: Progressing   Problem: Safety: Goal: Ability to remain free from injury will improve Outcome: Progressing   Problem: Skin Integrity: Goal: Risk for impaired skin integrity will  decrease Outcome: Progressing   

## 2023-03-19 DIAGNOSIS — C259 Malignant neoplasm of pancreas, unspecified: Secondary | ICD-10-CM | POA: Diagnosis not present

## 2023-03-19 DIAGNOSIS — E1111 Type 2 diabetes mellitus with ketoacidosis with coma: Secondary | ICD-10-CM | POA: Diagnosis not present

## 2023-03-19 DIAGNOSIS — C252 Malignant neoplasm of tail of pancreas: Secondary | ICD-10-CM

## 2023-03-19 LAB — CBC
HCT: 28.2 % — ABNORMAL LOW (ref 36.0–46.0)
Hemoglobin: 9 g/dL — ABNORMAL LOW (ref 12.0–15.0)
MCH: 29.1 pg (ref 26.0–34.0)
MCHC: 31.9 g/dL (ref 30.0–36.0)
MCV: 91.3 fL (ref 80.0–100.0)
Platelets: 262 10*3/uL (ref 150–400)
RBC: 3.09 MIL/uL — ABNORMAL LOW (ref 3.87–5.11)
RDW: 16.2 % — ABNORMAL HIGH (ref 11.5–15.5)
WBC: 8.1 10*3/uL (ref 4.0–10.5)
nRBC: 0.4 % — ABNORMAL HIGH (ref 0.0–0.2)

## 2023-03-19 LAB — COMPREHENSIVE METABOLIC PANEL
ALT: 9 U/L (ref 0–44)
AST: 12 U/L — ABNORMAL LOW (ref 15–41)
Albumin: 2.5 g/dL — ABNORMAL LOW (ref 3.5–5.0)
Alkaline Phosphatase: 79 U/L (ref 38–126)
Anion gap: 7 (ref 5–15)
BUN: 8 mg/dL (ref 6–20)
CO2: 21 mmol/L — ABNORMAL LOW (ref 22–32)
Calcium: 8.9 mg/dL (ref 8.9–10.3)
Chloride: 112 mmol/L — ABNORMAL HIGH (ref 98–111)
Creatinine, Ser: 0.8 mg/dL (ref 0.44–1.00)
GFR, Estimated: >60 mL/min (ref >60)
Glucose, Bld: 205 mg/dL — ABNORMAL HIGH (ref 70–99)
Potassium: 3.6 mmol/L (ref 3.5–5.1)
Sodium: 140 mmol/L (ref 135–145)
Total Bilirubin: 0.7 mg/dL (ref 0.3–1.2)
Total Protein: 5.3 g/dL — ABNORMAL LOW (ref 6.5–8.1)

## 2023-03-19 LAB — CULTURE, BLOOD (ROUTINE X 2)
Culture: NO GROWTH
Special Requests: ADEQUATE

## 2023-03-19 LAB — MAGNESIUM: Magnesium: 1.9 mg/dL (ref 1.7–2.4)

## 2023-03-19 LAB — GLUCOSE, CAPILLARY
Glucose-Capillary: 174 mg/dL — ABNORMAL HIGH (ref 70–99)
Glucose-Capillary: 266 mg/dL — ABNORMAL HIGH (ref 70–99)

## 2023-03-19 LAB — PHOSPHORUS: Phosphorus: 4.1 mg/dL (ref 2.5–4.6)

## 2023-03-19 MED ORDER — HEPARIN SOD (PORK) LOCK FLUSH 100 UNIT/ML IV SOLN
500.0000 [IU] | INTRAVENOUS | Status: AC | PRN
  Administered 2023-03-19: 500 [IU]

## 2023-03-19 NOTE — Discharge Summary (Signed)
Physician Discharge Summary  Regina Barry ZOX:[id number] DOB: 05/28/1961 DOA: 03/14/2023  PCP: Cathie Hoops, PA  Admit date: 03/14/2023 Discharge date: 03/19/2023  Admitted From: home  Disposition:  home   Recommendations for Outpatient Follow-up:  Follow up with PCP in 1-2 weeks F/u w/ onco at [facility] at previously scheduled appointment   Home Health: therapy recs HH but CM unable to set up HH secondary to pt's insurance  Equipment/Devices:  Discharge Condition: stable  CODE STATUS: full  Diet recommendation: Heart Healthy / Carb Modified  Brief/Interim Summary: HPI was taken from Dr. Para March: Regina Barry is a 62 y.o. 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  Patient is unable to contribute to the history.  Patient was apparently in her usual state of health 2 days prior. ED course and data review: Vitals within normal limits except for mild hypothermia of 97.5. Labs consistent with diabetic ketoacidosis with glucose over 1200, anion gap of 26, venous pH 7.19.  Bicarb 15 and sodium 128.Creatinine 2.38, up from baseline of 0.9 at [facility] on 5/21.  WBC normal at 8.7 but with lactic acid 4.2-3.4. EKG, personally viewed and interpreted with NSR at 76 Chest x-ray nonacute CT head without acute intracranial abnormality. Patient was initially started with fluid boluses and antibiotics for suspected severe sepsis however as it became clear at that it was DKA she was subsequently started on an insulin infusion. ICU was consulted, however due to pH above 7 they advised patient would be appropriate for stepdown. Hospitalist subsequently consulted.     Discharge Diagnoses:  Principal Problem:   Diabetic ketoacidosis with coma associated  with type 2 diabetes mellitus (HCC) Active Problems:   Lactic acidosis   AKI (acute kidney injury) (HCC)   Acinar cell carcinoma of pancreas (HCC)   Essential hypertension   Obesity, Class III, BMI 40-49.9 (morbid obesity) (HCC)   Altered mental status  DKA: w/ DM2. Resolved  DM2: HbA1c >15.5, very poorly controlled. Continue on glargine, SSI w/ acucchecks. Zofran, phenergan prn   Acute metabolic encephalopathy: likely secondary to DKA vs hypernatremia. Was only drinking sweat tea at home x 2 weeks as per pt's family member at beside. CT head shows no acute intracranial abnormalities. MRI brain shows no acute intracranial findings. Mental status is back to baseline. Resolved   Hypernatremia: resolved  Abd pain: likely secondary to pancreatic cancer. CT abd/pelvis showed shows no acute or focal lesion to explain pt's symptoms. Resolved    AKI: resolved   Vaginal yeast infection: completed fluconazole course  Urinary retention: s/p I&O x cath x 2. Foley placed 03/16/23 and will do a voiding trial 03/17/23. Resolved   Lactic acidosis: resolved    Obesity: BMI 31.3. 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 Sees onco at [facility]. Last chemo 03/07/23. Onco recs apprec   Normocytic anemia: likely secondary to recent chemo. No need for a transfusion currently   Hypokalemia: WNL today   Hypophosphatemia: WNL today   Discharge Instructions  Discharge Instructions     Diet - low sodium heart healthy   Complete by: As directed    Diet Carb Modified   Complete by: As directed  Discharge instructions   Complete by: As directed    Fu w/ PCP in 1-2 weeks. Fu w/ onco at [facility] at previously scheduled appointment.   Increase activity slowly   Complete by: As directed       Allergies as of 03/19/2023       Reactions   Diflucortolone    Doxycycline     Latex    Penicillins         Medication List     TAKE these medications    acetaminophen 325 MG suppository Commonly known as: TYLENOL Place 650 mg rectally every 6 (six) hours as needed for mild pain or fever.   amLODipine 10 MG tablet Commonly known as: NORVASC Take 10 mg by mouth daily.   dexamethasone 4 MG tablet Commonly known as: DECADRON Take 4 mg by mouth daily. As directed by oncology   DULoxetine 30 MG capsule Commonly known as: CYMBALTA Take 30 mg by mouth daily.   esomeprazole 40 MG capsule Commonly known as: NEXIUM Take 40 mg by mouth daily at 12 noon.   ezetimibe 10 MG tablet Commonly known as: ZETIA Take 10 mg by mouth daily.   Ferrous Fumarate 324 (106 Fe) MG Tabs tablet Commonly known as: HEMOCYTE - 106 mg FE Take 1 tablet by mouth every other day.   FreeStyle Libre 14 Day Reader Hardie Pulley by Does not apply route.   furosemide 40 MG tablet Commonly known as: LASIX Take 40 mg by mouth daily.   glipiZIDE 10 MG 24 hr tablet Commonly known as: GLUCOTROL XL Take 10 mg by mouth daily with breakfast.   hydrOXYzine 25 MG tablet Commonly known as: ATARAX Take 25 mg by mouth every 8 (eight) hours as needed for itching.   insulin glargine 100 UNIT/ML injection Commonly known as: LANTUS Inject 25 Units into the skin daily.   insulin lispro 100 UNIT/ML injection Commonly known as: HUMALOG Inject 10-30 Units into the skin 3 (three) times daily before meals. Inject 10 units with breakfast, lunch, and dinner plus correction 2 units per 50 above 200. Max TDD: 60 units   lidocaine 2 % solution Commonly known as: XYLOCAINE Use as directed 15 mLs in the mouth or throat 4 (four) times daily as needed for mouth pain.   metFORMIN 500 MG tablet Commonly known as: GLUCOPHAGE Take 1,000 mg by mouth 2 (two) times daily with a meal.   metoprolol succinate 100 MG 24 hr tablet Commonly known as: TOPROL-XL Take 100 mg by mouth daily. Take with or immediately  following a meal.   naproxen 500 MG tablet Commonly known as: NAPROSYN Take 500 mg by mouth 2 (two) times daily with a meal.   OLANZapine 5 MG tablet Commonly known as: ZYPREXA Take 5 mg by mouth at bedtime.   olmesartan 40 MG tablet Commonly known as: BENICAR Take 40 mg by mouth daily.   ondansetron 8 MG tablet Commonly known as: ZOFRAN Take 8 mg by mouth every 12 (twelve) hours as needed for nausea or vomiting.   oxyCODONE 5 MG immediate release tablet Commonly known as: Oxy IR/ROXICODONE Take 5 mg by mouth every 4 (four) hours as needed for severe pain.   potassium chloride 10 MEQ CR capsule Commonly known as: MICRO-K Take 10 mEq by mouth daily.   prochlorperazine 10 MG tablet Commonly known as: COMPAZINE Take 10 mg by mouth every 6 (six) hours as needed for nausea or vomiting.   spironolactone 25 MG tablet Commonly known as: ALDACTONE Take 25  mg by mouth daily.   temazepam 15 MG capsule Commonly known as: RESTORIL Take 15 mg by mouth at bedtime as needed for sleep.   Zenpep 25000-79000 units Cpep Generic drug: Pancrelipase (Lip-Prot-Amyl) Take 2-3 capsules by mouth 3 (three) times daily before meals. 3 caps AC, 2 before snacks        Allergies  Allergen Reactions   Diflucortolone    Doxycycline    Latex    Penicillins     Consultations: Onco    Procedures/Studies: CT ABDOMEN PELVIS WO CONTRAST  Result Date: 03/16/2023 CLINICAL DATA:  Abdominal pain, acute, nonlocalized. Question renal/ureteral stone. Personal history of prostate cancer. EXAM: CT ABDOMEN AND PELVIS WITHOUT CONTRAST TECHNIQUE: Multidetector CT imaging of the abdomen and pelvis was performed following the standard protocol without IV contrast. RADIATION DOSE REDUCTION: This exam was performed according to the departmental dose-optimization program which includes automated exposure control, adjustment of the mA and/or kV according to patient size and/or use of iterative reconstruction  technique. COMPARISON:  CT of the abdomen and pelvis 06/09/2012. One-view abdominal radiograph 03/15/2023. FINDINGS: Lower chest: Linear airspace opacities are present at the left base. Minimal atelectasis is present at the right base. The heart size is normal. No significant pleural or pericardial effusion is present. Hepatobiliary: No focal liver abnormality is seen. No gallstones, gallbladder wall thickening, or biliary dilatation. The liver is somewhat distended. Pancreas: Partial resection of the pancreas scratched at partial resection of pancreatic tail noted. The remaining pancreas is within normal limits. Spleen: Splenectomy noted. A focus of splenic tissue is present along the inferior aspect of the posterior left hemidiaphragm. Adrenals/Urinary Tract: The adrenal glands are normal bilaterally. The kidneys are unremarkable. No stone or mass lesion is present. Ureters are normal bilaterally. The urinary bladder is normal. Stomach/Bowel: The stomach and duodenum are within normal limits. Small bowel is unremarkable. Terminal ileum is normal. The appendix is visualized and within normal limits. The ascending and transverse colon are normal. The descending and sigmoid colon are normal. Vascular/Lymphatic: Scattered atherosclerotic calcifications are present in the aorta and branch vessels. No aneurysm is present. No significant adenopathy is present. Reproductive: Uterus and bilateral adnexa are unremarkable. Other: No abdominal wall hernia or abnormality. No abdominopelvic ascites. Musculoskeletal: Grade 1 anterolisthesis at L4-5 measures 8 mm. Uncovering of the disc and bilateral facet spurring results in severe central canal stenosis at this level. Moderate foraminal narrowing is present bilaterally. Slightly less severe disc disease and central canal stenosis is present at L2-3 and L3-4. Rightward curvature of the lower lumbar spine is noted. No focal osseous lesions are present. The hips are located and  within normal limits. IMPRESSION: 1. No acute or focal lesion to explain the patient's symptoms. 2. 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 3. Grade 1 anterolisthesis at L4-5 with severe central canal stenosis and moderate foraminal narrowing bilaterally. 4. Slightly less severe disc disease and central canal stenosis at L2-3 and L3-4. 5.  Aortic Atherosclerosis (ICD10-I70.0). Electronically Signed   By: Marin Roberts M.D.   On: 03/16/2023 16:12   MR BRAIN WO CONTRAST  Result Date: 03/15/2023 CLINICAL DATA:  Mental status change, unknown cause. EXAM: MRI HEAD WITHOUT CONTRAST TECHNIQUE: Multiplanar, multiecho pulse sequences of the brain and surrounding structures were obtained without intravenous contrast. COMPARISON:  Head CT 03/14/2023 FINDINGS: The study is mildly motion degraded despite utilizing motion resistant imaging protocols and repeat imaging. Brain: There is no evidence of an acute infarct, mass, midline shift, or extra-axial fluid collection.  A few punctate foci of T2 FLAIR hyperintensity in the cerebral white matter are nonspecific but may reflect minimal chronic small vessel ischemic disease and are not considered abnormal for age. A single chronic microhemorrhage is suspected in the posterior left frontal lobe. There is a partially empty sella. There is mild generalized cerebral atrophy. Vascular: Major intracranial vascular flow voids are preserved. Skull and upper cervical spine: Unremarkable bone marrow signal para Sinuses/Orbits: Unremarkable orbits. Clear paranasal sinuses. No significant mastoid fluid. Other: None. IMPRESSION: No acute intracranial abnormality. Electronically Signed   By: Sebastian Ache M.D.   On: 03/15/2023 22:00   DG Abd Portable 1V  Result Date: 03/15/2023 CLINICAL DATA:  Abdominal pain EXAM: PORTABLE ABDOMEN - 1 VIEW COMPARISON:  03/28/2011 FINDINGS: Bowel gas pattern is nonspecific. There is 1.3 cm calcific density in left paraspinal region  at the L2 level in the course of left ureter. Kidneys are partly obscured by bowel contents. Phleboliths are seen in pelvis. IMPRESSION: Nonspecific bowel gas pattern. There is 1.3 cm calcific density in left paraspinal region in the proximal course of left ureter. If there is clinical suspicion for ureteric calculus, follow-up CT may be considered. Electronically Signed   By: Ernie Avena M.D.   On: 03/15/2023 13:42   DG Chest Port 1 View  Result Date: 03/14/2023 CLINICAL DATA:  Sepsis EXAM: PORTABLE CHEST 1 VIEW COMPARISON:  02/24/2021 FINDINGS: Lung volumes are small. No pneumothorax or pleural effusion. Right internal jugular chest port tip is seen within the superior vena cava. Cardiac size within normal limits. Pulmonary vascularity is normal. No acute bone abnormality. IMPRESSION: 1. Pulmonary hypoinflation. Electronically Signed   By: Helyn Numbers M.D.   On: 03/14/2023 21:47   CT Head Wo Contrast  Result Date: 03/14/2023 CLINICAL DATA:  Altered mental status. EXAM: CT HEAD WITHOUT CONTRAST TECHNIQUE: Contiguous axial images were obtained from the base of the skull through the vertex without intravenous contrast. RADIATION DOSE REDUCTION: This exam was performed according to the departmental dose-optimization program which includes automated exposure control, adjustment of the mA and/or kV according to patient size and/or use of iterative reconstruction technique. COMPARISON:  None Available. FINDINGS: Brain: There is mild cerebral atrophy with widening of the extra-axial spaces and ventricular dilatation. There are areas of decreased attenuation within the white matter tracts of the supratentorial brain, consistent with microvascular disease changes. Vascular: No hyperdense vessel or unexpected calcification. Skull: Normal. Negative for fracture or focal lesion. Sinuses/Orbits: No acute finding. Other: None. IMPRESSION: 1. No acute intracranial abnormality. Electronically Signed   By:  Aram Candela M.D.   On: 03/14/2023 20:34   (Echo, Carotid, EGD, Colonoscopy, ERCP)    Subjective: Pt denies any complaints    Discharge Exam: Vitals:   03/19/23 0430 03/19/23 0717  BP: 136/69 (!) 141/76  Pulse: 81 77  Resp: 16 16  Temp: 98.1 F (36.7 C) 98 F (36.7 C)  SpO2: 98% 95%   Vitals:   03/19/23 0022 03/19/23 0430 03/19/23 0500 03/19/23 0717  BP: (!) 150/76 136/69  (!) 141/76  Pulse: 86 81  77  Resp: 16 16  16   Temp: 98.4 F (36.9 C) 98.1 F (36.7 C)  98 F (36.7 C)  TempSrc:      SpO2: 94% 98%  95%  Weight:   84.1 kg   Height:        General: Pt is alert, awake, not in acute distress Cardiovascular: S1/S2 +, no rubs, no gallops Respiratory: CTA bilaterally, no wheezing, no  rhonchi Abdominal: Soft, NT,obese, bowel sounds + Extremities: no cyanosis    The results of significant diagnostics from this hospitalization (including imaging, microbiology, ancillary and laboratory) are listed below for reference.     Microbiology: Recent Results (from the past 240 hour(s))  Blood culture (routine x 2)     Status: None   Collection Time: 03/14/23  8:17 PM   Specimen: BLOOD  Result Value Ref Range Status   Specimen Description BLOOD BLOOD LEFT ARM  Final   Special Requests   Final    BOTTLES DRAWN AEROBIC AND ANAEROBIC Blood Culture adequate volume   Culture   Final    NO GROWTH 5 DAYS Performed at [facility], [address], [place], [place] [address]    Report Status 03/19/2023 FINAL  Final  Blood culture (routine x 2)     Status: None   Collection Time: 03/14/23  9:22 PM   Specimen: BLOOD  Result Value Ref Range Status   Specimen Description BLOOD port  Final   Special Requests   Final    BOTTLES DRAWN AEROBIC AND ANAEROBIC Blood Culture adequate volume   Culture   Final    NO GROWTH 5 DAYS Performed at [facility], [address], [place], [place] [address]    Report Status 03/19/2023 FINAL  Final  Urine Culture      Status: None   Collection Time: 03/14/23  9:22 PM   Specimen: Urine, Random  Result Value Ref Range Status   Specimen Description   Final    URINE, RANDOM Performed at [facility], [address], [place], [place] [address]    Special Requests   Final    NONE Performed at [facility], [address] [place], [place] [address]    Culture   Final    NO GROWTH Performed at [facility], [address] [place], [place] [address]    Report Status 03/16/2023 FINAL  Final  MRSA Next Gen by PCR, Nasal     Status: None   Collection Time: 03/15/23  3:47 AM   Specimen: Nasal Mucosa; Nasal Swab  Result Value Ref Range Status   MRSA by PCR Next Gen NOT DETECTED NOT DETECTED Final    Comment: (NOTE) The GeneXpert MRSA Assay (FDA approved for NASAL specimens only), is one component of a comprehensive MRSA colonization surveillance program. It is not intended to diagnose MRSA infection nor to guide or monitor treatment for MRSA infections. Test performance is not FDA approved in patients less than 18 years old. Performed at [facility], [address] [place], [place] [address]      Labs: BNP (last 3 results) No results for input(s): "BNP" in the last 8760 hours. Basic Metabolic Panel: Recent Labs  Lab 03/15/23 1610 03/16/23 0405 03/16/23 1300 03/16/23 1820 03/17/23 0552 03/18/23 0451 03/19/23 0610  NA 153* 153* 148* 148* 144 142 140  K 3.1* 3.9  --   --  3.3* 3.4* 3.6  CL 121* 123*  --   --  115* 113* 112*  CO2 23 22  --   --  23 22 21*  GLUCOSE 172* 246*  --   --  230* 137* 205*  BUN 24* 19  --   --  9 8 8   CREATININE 1.14* 1.09*  --   --  0.83 0.72 0.80  CALCIUM 7.9* 9.7  --   --  8.9 8.7* 8.9  MG  --   --   --   --   --  2.1 1.9  PHOS  --   --   --   --   --  1.9* 4.1   Liver Function Tests: Recent Labs  Lab 03/14/23 2017 03/16/23 0405 03/17/23 0552 03/18/23 0451 03/19/23 0610  AST 17 17 17  12* 12*  ALT 14 9 10 10 9    ALKPHOS 148* 86 78 80 79  BILITOT 1.7* 0.3 0.6 0.5 0.7  PROT 8.4* 5.9* 5.3* 5.4* 5.3*  ALBUMIN 4.2 2.8* 2.6* 2.5* 2.5*   Recent Labs  Lab 03/15/23 1610  LIPASE 331*   No results for input(s): "AMMONIA" in the last 168 hours. CBC: Recent Labs  Lab 03/14/23 2017 03/16/23 0405 03/17/23 0552 03/18/23 0451 03/19/23 0610  WBC 8.7 12.1* 11.9* 10.2 8.1  NEUTROABS 6.5  --   --   --   --   HGB 14.2 10.9* 9.6* 9.3* 9.0*  HCT 47.8* 33.5* 29.6* 28.6* 28.2*  MCV 97.6 91.0 91.9 90.2 91.3  PLT 426* 305 238 229 262   Cardiac Enzymes: No results for input(s): "CKTOTAL", "CKMB", "CKMBINDEX", "TROPONINI" in the last 168 hours. BNP: Invalid input(s): "POCBNP" CBG: Recent Labs  Lab 03/18/23 0814 03/18/23 1135 03/18/23 1555 03/18/23 2120 03/19/23 0718  GLUCAP 155* 248* 246* 241* 174*   D-Dimer No results for input(s): "DDIMER" in the last 72 hours. Hgb A1c No results for input(s): "HGBA1C" in the last 72 hours. Lipid Profile No results for input(s): "CHOL", "HDL", "LDLCALC", "TRIG", "CHOLHDL", "LDLDIRECT" in the last 72 hours. Thyroid function studies No results for input(s): "TSH", "T4TOTAL", "T3FREE", "THYROIDAB" in the last 72 hours.  Invalid input(s): "FREET3" Anemia work up No results for input(s): "VITAMINB12", "FOLATE", "FERRITIN", "TIBC", "IRON", "RETICCTPCT" in the last 72 hours. Urinalysis    Component Value Date/Time   COLORURINE STRAW (A) 03/14/2023 2122   APPEARANCEUR CLEAR (A) 03/14/2023 2122   APPEARANCEUR Clear 06/09/2012 1650   LABSPEC 1.028 03/14/2023 2122   LABSPEC 1.005 06/09/2012 1650   PHURINE 5.0 03/14/2023 2122   GLUCOSEU >=500 (A) 03/14/2023 2122   GLUCOSEU Negative 06/09/2012 1650   HGBUR NEGATIVE 03/14/2023 2122   BILIRUBINUR NEGATIVE 03/14/2023 2122   BILIRUBINUR Negative 06/09/2012 1650   KETONESUR 5 (A) 03/14/2023 2122   PROTEINUR NEGATIVE 03/14/2023 2122   NITRITE NEGATIVE 03/14/2023 2122   LEUKOCYTESUR NEGATIVE 03/14/2023 2122    LEUKOCYTESUR Negative 06/09/2012 1650   Sepsis Labs Recent Labs  Lab 03/16/23 0405 03/17/23 0552 03/18/23 0451 03/19/23 0610  WBC 12.1* 11.9* 10.2 8.1   Microbiology Recent Results (from the past 240 hour(s))  Blood culture (routine x 2)     Status: None   Collection Time: 03/14/23  8:17 PM   Specimen: BLOOD  Result Value Ref Range Status   Specimen Description BLOOD BLOOD LEFT ARM  Final   Special Requests   Final    BOTTLES DRAWN AEROBIC AND ANAEROBIC Blood Culture adequate volume   Culture   Final    NO GROWTH 5 DAYS Performed at [facility], [address] [place], [place] [address]    Report Status 03/19/2023 FINAL  Final  Blood culture (routine x 2)     Status: None   Collection Time: 03/14/23  9:22 PM   Specimen: BLOOD  Result Value Ref Range Status   Specimen Description BLOOD port  Final   Special Requests   Final    BOTTLES DRAWN AEROBIC AND ANAEROBIC Blood Culture adequate volume   Culture   Final    NO GROWTH 5 DAYS Performed at [facility], [address], [place], [place] [address]    Report Status 03/19/2023 FINAL  Final  Urine Culture     Status: None   Collection Time: 03/14/23  9:22 PM   Specimen: Urine, Random  Result Value Ref Range Status   Specimen Description   Final    URINE, RANDOM Performed at [facility], [address], [place], [place] [address]    Special Requests   Final    NONE Performed at [facility], [address] [place], [place] [address]    Culture   Final    NO GROWTH Performed at [facility], [address], [place], [place] [address]    Report Status 03/16/2023 FINAL  Final  MRSA Next Gen by PCR, Nasal     Status: None   Collection Time: 03/15/23  3:47 AM   Specimen: Nasal Mucosa; Nasal Swab  Result Value Ref Range Status   MRSA by PCR Next Gen NOT DETECTED NOT DETECTED Final    Comment: (NOTE) The GeneXpert MRSA Assay (FDA approved for NASAL specimens only), is  one component of a comprehensive MRSA colonization surveillance program. It is not intended to diagnose MRSA infection nor to guide or monitor treatment for MRSA infections. Test performance is not FDA approved in patients less than 41 years old. Performed at [facility], [address], [place], [place] [address]      Time coordinating discharge: Over 30 minutes  SIGNED:   Charise Killian, MD  [facility] 03/19/2023, 11:44 AM Pager   If 7PM-7AM, please contact night-coverage [email or web address]

## 2023-03-19 NOTE — TOC Transition Note (Signed)
Transition of Care [facility]) - CM/SW Discharge Note   Patient Details  Name: Regina Barry MRN: [id number] Date of Birth: May 13, 1961  [facility]) CM/SW Contact:  Bing Quarry, RN Phone Number: 03/19/2023, 12:34 PM   Clinical Narrative:  6/16: Patient has discharge orders for [facility] but unable to secure 2/2 insurance. Provider was okay with switching to Outpatient if needed. Spoke to daughter and Outpatient was okay with her and can provider transportation. Reviewed choices and chose [facility] OP rehab services. OP rehab progress order signed by provider and routed to [facility] OP, but suggested daughter contact them Monday to ensure they received the order. Recommended DME will be purchased via retail means if needed at home per daughter. Daughter will transport home. Patient has PCP. Gabriel Cirri RN CM (weekends only) [phone].      Final next level of care: OP Rehab Barriers to Discharge: Barriers Resolved   Patient Goals and CMS Choice      Discharge Placement                         Discharge Plan and Services Additional resources added to the After Visit Summary for                  DME Arranged: N/A DME Agency: NA (Will purchase from retail)       HH Arranged: NA          Social Determinants of Health (SDOH) Interventions SDOH Screenings   Food Insecurity: No Food Insecurity (03/16/2023)  Housing: Low Risk  (03/16/2023)  Transportation Needs: No Transportation Needs (03/16/2023)  Utilities: Not At Risk (03/16/2023)  Tobacco Use: Low Risk  (03/14/2023)     Readmission Risk Interventions     No data to display

## 2023-03-19 NOTE — Progress Notes (Signed)
Occupational Therapy * Physical Therapy * Speech Therapy  DATE: 03/19/2023  PATIENT NAME: Regina Barry  PATIENT MRN: [id number]   DIAGNOSIS/DIAGNOSIS CODE: E11.11 DATE OF DISCHARGE: 03/19/2023  PRIMARY CARE PHYSICIAN: Cathie Hoops, PA PCP PHONE/FAX: [phone] (Phone)   Dear Provider [facility] Outpatient Rehab Services.    Fax: [phone]   I certify that I have examined this patient and that occupational/physical/speech therapy is necessary on an outpatient basis.    The patient has expressed interest in completing their recommended course of therapy at your location.  Once a formal order from the patient's primary care physician has been obtained, please contact him/her to schedule an appointment for evaluation at your earliest convenience.  [ X ]  Physical Therapy Evaluate and Treat  [ X ]  Occupational Therapy Evaluate and Treat  [  ]  Speech Therapy Evaluate and Treat  The patient's primary care physician (listed above) must furnish and be responsible for a formal order such that the recommended services may be furnished while under the primary physician's care, and that the plan of care will be established and reviewed every 30 days (or more often if condition necessitates).

## 2023-03-19 NOTE — Plan of Care (Signed)
  Problem: Education: Goal: Ability to describe self-care measures that may prevent or decrease complications (Diabetes Survival Skills Education) will improve Outcome: Adequate for Discharge   Problem: Coping: Goal: Ability to adjust to condition or change in health will improve Outcome: Adequate for Discharge   Problem: Fluid Volume: Goal: Ability to maintain a balanced intake and output will improve Outcome: Adequate for Discharge   Problem: Health Behavior/Discharge Planning: Goal: Ability to identify and utilize available resources and services will improve Outcome: Adequate for Discharge Goal: Ability to manage health-related needs will improve Outcome: Adequate for Discharge   Problem: Metabolic: Goal: Ability to maintain appropriate glucose levels will improve Outcome: Adequate for Discharge   Problem: Nutritional: Goal: Maintenance of adequate nutrition will improve Outcome: Adequate for Discharge Goal: Progress toward achieving an optimal weight will improve Outcome: Adequate for Discharge   Problem: Skin Integrity: Goal: Risk for impaired skin integrity will decrease Outcome: Adequate for Discharge   Problem: Tissue Perfusion: Goal: Adequacy of tissue perfusion will improve Outcome: Adequate for Discharge   Problem: Education: Goal: Ability to describe self-care measures that may prevent or decrease complications (Diabetes Survival Skills Education) will improve Outcome: Adequate for Discharge   Problem: Cardiac: Goal: Ability to maintain an adequate cardiac output will improve Outcome: Adequate for Discharge   Problem: Health Behavior/Discharge Planning: Goal: Ability to identify and utilize available resources and services will improve Outcome: Adequate for Discharge Goal: Ability to manage health-related needs will improve Outcome: Adequate for Discharge   Problem: Fluid Volume: Goal: Ability to achieve a balanced intake and output will  improve Outcome: Adequate for Discharge   Problem: Metabolic: Goal: Ability to maintain appropriate glucose levels will improve Outcome: Adequate for Discharge   Problem: Nutritional: Goal: Maintenance of adequate nutrition will improve Outcome: Adequate for Discharge Goal: Maintenance of adequate weight for body size and type will improve Outcome: Adequate for Discharge   Problem: Respiratory: Goal: Will regain and/or maintain adequate ventilation Outcome: Adequate for Discharge   Problem: Urinary Elimination: Goal: Ability to achieve and maintain adequate renal perfusion and functioning will improve Outcome: Adequate for Discharge   Problem: Education: Goal: Knowledge of General Education information will improve Description: Including pain rating scale, medication(s)/side effects and non-pharmacologic comfort measures Outcome: Adequate for Discharge   Problem: Health Behavior/Discharge Planning: Goal: Ability to manage health-related needs will improve Outcome: Adequate for Discharge   Problem: Clinical Measurements: Goal: Ability to maintain clinical measurements within normal limits will improve Outcome: Adequate for Discharge Goal: Will remain free from infection Outcome: Adequate for Discharge Goal: Diagnostic test results will improve Outcome: Adequate for Discharge Goal: Respiratory complications will improve Outcome: Adequate for Discharge Goal: Cardiovascular complication will be avoided Outcome: Adequate for Discharge   Problem: Activity: Goal: Risk for activity intolerance will decrease Outcome: Adequate for Discharge   Problem: Nutrition: Goal: Adequate nutrition will be maintained Outcome: Adequate for Discharge   Problem: Coping: Goal: Level of anxiety will decrease Outcome: Adequate for Discharge   Problem: Elimination: Goal: Will not experience complications related to bowel motility Outcome: Adequate for Discharge Goal: Will not experience  complications related to urinary retention Outcome: Adequate for Discharge   Problem: Pain Managment: Goal: General experience of comfort will improve Outcome: Adequate for Discharge   Problem: Safety: Goal: Ability to remain free from injury will improve Outcome: Adequate for Discharge   Problem: Skin Integrity: Goal: Risk for impaired skin integrity will decrease Outcome: Adequate for Discharge

## 2023-03-19 NOTE — Progress Notes (Signed)
MD order received in [facility] to discharge pt home with out-patient rehab services today; TOC previously established out-patient rehab services with [facility]; verbally reviewed AVS with pt, no new Rx medications nor changes with any medications; pt to call [facility] Out-Patient Rehab Services in the am to clariify services and set up an appointment; no questions voiced at this time; pt's discharge pending arrival of her daughter with her clothes

## 2023-03-19 NOTE — Progress Notes (Signed)
Pt's daughter present and pt dressed for discharge; pt discharged via wheelchair by nursing to the [facility]

## 2023-03-19 NOTE — Plan of Care (Signed)
  Problem: Education: Goal: Ability to describe self-care measures that may prevent or decrease complications (Diabetes Survival Skills Education) will improve Outcome: Progressing   Problem: Coping: Goal: Ability to adjust to condition or change in health will improve Outcome: Progressing   Problem: Fluid Volume: Goal: Ability to maintain a balanced intake and output will improve Outcome: Progressing   Problem: Health Behavior/Discharge Planning: Goal: Ability to identify and utilize available resources and services will improve Outcome: Progressing Goal: Ability to manage health-related needs will improve Outcome: Progressing   Problem: Metabolic: Goal: Ability to maintain appropriate glucose levels will improve Outcome: Progressing   Problem: Nutritional: Goal: Maintenance of adequate nutrition will improve Outcome: Progressing Goal: Progress toward achieving an optimal weight will improve Outcome: Progressing   Problem: Skin Integrity: Goal: Risk for impaired skin integrity will decrease Outcome: Progressing   Problem: Tissue Perfusion: Goal: Adequacy of tissue perfusion will improve Outcome: Progressing   Problem: Education: Goal: Ability to describe self-care measures that may prevent or decrease complications (Diabetes Survival Skills Education) will improve Outcome: Progressing   Problem: Cardiac: Goal: Ability to maintain an adequate cardiac output will improve Outcome: Progressing   Problem: Health Behavior/Discharge Planning: Goal: Ability to identify and utilize available resources and services will improve Outcome: Progressing Goal: Ability to manage health-related needs will improve Outcome: Progressing   Problem: Fluid Volume: Goal: Ability to achieve a balanced intake and output will improve Outcome: Progressing   Problem: Metabolic: Goal: Ability to maintain appropriate glucose levels will improve Outcome: Progressing   Problem:  Nutritional: Goal: Maintenance of adequate nutrition will improve Outcome: Progressing Goal: Maintenance of adequate weight for body size and type will improve Outcome: Progressing   Problem: Respiratory: Goal: Will regain and/or maintain adequate ventilation Outcome: Progressing   Problem: Urinary Elimination: Goal: Ability to achieve and maintain adequate renal perfusion and functioning will improve Outcome: Progressing   Problem: Education: Goal: Knowledge of General Education information will improve Description: Including pain rating scale, medication(s)/side effects and non-pharmacologic comfort measures Outcome: Progressing   Problem: Health Behavior/Discharge Planning: Goal: Ability to manage health-related needs will improve Outcome: Progressing   Problem: Clinical Measurements: Goal: Ability to maintain clinical measurements within normal limits will improve Outcome: Progressing Goal: Will remain free from infection Outcome: Progressing Goal: Diagnostic test results will improve Outcome: Progressing Goal: Respiratory complications will improve Outcome: Progressing Goal: Cardiovascular complication will be avoided Outcome: Progressing   Problem: Activity: Goal: Risk for activity intolerance will decrease Outcome: Progressing   Problem: Nutrition: Goal: Adequate nutrition will be maintained Outcome: Progressing   Problem: Coping: Goal: Level of anxiety will decrease Outcome: Progressing   Problem: Elimination: Goal: Will not experience complications related to bowel motility Outcome: Progressing Goal: Will not experience complications related to urinary retention Outcome: Progressing   Problem: Pain Managment: Goal: General experience of comfort will improve Outcome: Progressing   Problem: Safety: Goal: Ability to remain free from injury will improve Outcome: Progressing   Problem: Skin Integrity: Goal: Risk for impaired skin integrity will  decrease Outcome: Progressing   

## 2023-03-19 NOTE — Discharge Instructions (Signed)
[facility] Out-Patient Therapy Services

## 2023-03-20 DIAGNOSIS — E1165 Type 2 diabetes mellitus with hyperglycemia: Secondary | ICD-10-CM | POA: Diagnosis not present

## 2023-03-20 DIAGNOSIS — Z794 Long term (current) use of insulin: Secondary | ICD-10-CM | POA: Diagnosis not present

## 2023-03-20 DIAGNOSIS — C259 Malignant neoplasm of pancreas, unspecified: Secondary | ICD-10-CM | POA: Diagnosis not present

## 2023-03-20 DIAGNOSIS — Z7189 Other specified counseling: Secondary | ICD-10-CM | POA: Diagnosis not present

## 2023-03-21 DIAGNOSIS — E1165 Type 2 diabetes mellitus with hyperglycemia: Secondary | ICD-10-CM | POA: Diagnosis not present

## 2023-03-21 DIAGNOSIS — Z794 Long term (current) use of insulin: Secondary | ICD-10-CM | POA: Diagnosis not present

## 2023-03-21 DIAGNOSIS — Z7189 Other specified counseling: Secondary | ICD-10-CM | POA: Diagnosis not present

## 2023-03-28 DIAGNOSIS — C259 Malignant neoplasm of pancreas, unspecified: Secondary | ICD-10-CM | POA: Diagnosis not present

## 2023-03-28 DIAGNOSIS — C252 Malignant neoplasm of tail of pancreas: Secondary | ICD-10-CM | POA: Diagnosis not present

## 2023-03-28 DIAGNOSIS — G4700 Insomnia, unspecified: Secondary | ICD-10-CM | POA: Diagnosis not present

## 2023-03-28 DIAGNOSIS — R1310 Dysphagia, unspecified: Secondary | ICD-10-CM | POA: Diagnosis not present

## 2023-03-28 DIAGNOSIS — N879 Dysplasia of cervix uteri, unspecified: Secondary | ICD-10-CM | POA: Diagnosis not present

## 2023-03-28 DIAGNOSIS — Z5111 Encounter for antineoplastic chemotherapy: Secondary | ICD-10-CM | POA: Diagnosis not present

## 2023-03-28 DIAGNOSIS — K8689 Other specified diseases of pancreas: Secondary | ICD-10-CM | POA: Diagnosis not present

## 2023-03-28 DIAGNOSIS — Z79899 Other long term (current) drug therapy: Secondary | ICD-10-CM | POA: Diagnosis not present

## 2023-03-28 DIAGNOSIS — E871 Hypo-osmolality and hyponatremia: Secondary | ICD-10-CM | POA: Diagnosis not present

## 2023-03-28 DIAGNOSIS — E1165 Type 2 diabetes mellitus with hyperglycemia: Secondary | ICD-10-CM | POA: Diagnosis not present

## 2023-03-28 DIAGNOSIS — D649 Anemia, unspecified: Secondary | ICD-10-CM | POA: Diagnosis not present

## 2023-03-28 DIAGNOSIS — R52 Pain, unspecified: Secondary | ICD-10-CM | POA: Diagnosis not present

## 2023-03-28 DIAGNOSIS — Z794 Long term (current) use of insulin: Secondary | ICD-10-CM | POA: Diagnosis not present

## 2023-03-28 DIAGNOSIS — R110 Nausea: Secondary | ICD-10-CM | POA: Diagnosis not present

## 2023-04-11 DIAGNOSIS — R0609 Other forms of dyspnea: Secondary | ICD-10-CM | POA: Diagnosis not present

## 2023-04-11 DIAGNOSIS — C259 Malignant neoplasm of pancreas, unspecified: Secondary | ICD-10-CM | POA: Diagnosis not present

## 2023-04-20 DIAGNOSIS — I10 Essential (primary) hypertension: Secondary | ICD-10-CM | POA: Diagnosis not present

## 2023-04-20 DIAGNOSIS — E1165 Type 2 diabetes mellitus with hyperglycemia: Secondary | ICD-10-CM | POA: Diagnosis not present

## 2023-04-20 DIAGNOSIS — E538 Deficiency of other specified B group vitamins: Secondary | ICD-10-CM | POA: Diagnosis not present

## 2023-04-20 DIAGNOSIS — E785 Hyperlipidemia, unspecified: Secondary | ICD-10-CM | POA: Diagnosis not present

## 2023-04-20 DIAGNOSIS — D500 Iron deficiency anemia secondary to blood loss (chronic): Secondary | ICD-10-CM | POA: Diagnosis not present

## 2023-04-20 DIAGNOSIS — R002 Palpitations: Secondary | ICD-10-CM | POA: Diagnosis not present

## 2023-04-25 DIAGNOSIS — R0609 Other forms of dyspnea: Secondary | ICD-10-CM | POA: Diagnosis not present

## 2023-04-25 DIAGNOSIS — G4700 Insomnia, unspecified: Secondary | ICD-10-CM | POA: Diagnosis not present

## 2023-04-25 DIAGNOSIS — D649 Anemia, unspecified: Secondary | ICD-10-CM | POA: Diagnosis not present

## 2023-04-25 DIAGNOSIS — Z794 Long term (current) use of insulin: Secondary | ICD-10-CM | POA: Diagnosis not present

## 2023-04-25 DIAGNOSIS — E871 Hypo-osmolality and hyponatremia: Secondary | ICD-10-CM | POA: Diagnosis not present

## 2023-04-25 DIAGNOSIS — D500 Iron deficiency anemia secondary to blood loss (chronic): Secondary | ICD-10-CM | POA: Diagnosis not present

## 2023-04-25 DIAGNOSIS — E538 Deficiency of other specified B group vitamins: Secondary | ICD-10-CM | POA: Diagnosis not present

## 2023-04-25 DIAGNOSIS — R1310 Dysphagia, unspecified: Secondary | ICD-10-CM | POA: Diagnosis not present

## 2023-04-25 DIAGNOSIS — C252 Malignant neoplasm of tail of pancreas: Secondary | ICD-10-CM | POA: Diagnosis not present

## 2023-04-25 DIAGNOSIS — Z79899 Other long term (current) drug therapy: Secondary | ICD-10-CM | POA: Diagnosis not present

## 2023-04-25 DIAGNOSIS — K8689 Other specified diseases of pancreas: Secondary | ICD-10-CM | POA: Diagnosis not present

## 2023-04-25 DIAGNOSIS — E1165 Type 2 diabetes mellitus with hyperglycemia: Secondary | ICD-10-CM | POA: Diagnosis not present

## 2023-04-25 DIAGNOSIS — Z5111 Encounter for antineoplastic chemotherapy: Secondary | ICD-10-CM | POA: Diagnosis not present

## 2023-04-25 DIAGNOSIS — R110 Nausea: Secondary | ICD-10-CM | POA: Diagnosis not present

## 2023-04-25 DIAGNOSIS — T451X5A Adverse effect of antineoplastic and immunosuppressive drugs, initial encounter: Secondary | ICD-10-CM | POA: Diagnosis not present

## 2023-04-25 DIAGNOSIS — E785 Hyperlipidemia, unspecified: Secondary | ICD-10-CM | POA: Diagnosis not present

## 2023-04-25 DIAGNOSIS — C259 Malignant neoplasm of pancreas, unspecified: Secondary | ICD-10-CM | POA: Diagnosis not present

## 2023-05-09 DIAGNOSIS — C259 Malignant neoplasm of pancreas, unspecified: Secondary | ICD-10-CM | POA: Diagnosis not present

## 2023-05-09 DIAGNOSIS — G620 Drug-induced polyneuropathy: Secondary | ICD-10-CM | POA: Diagnosis not present

## 2023-05-09 DIAGNOSIS — Z79899 Other long term (current) drug therapy: Secondary | ICD-10-CM | POA: Diagnosis not present

## 2023-05-09 DIAGNOSIS — Z794 Long term (current) use of insulin: Secondary | ICD-10-CM | POA: Diagnosis not present

## 2023-05-09 DIAGNOSIS — D649 Anemia, unspecified: Secondary | ICD-10-CM | POA: Diagnosis not present

## 2023-05-09 DIAGNOSIS — R0609 Other forms of dyspnea: Secondary | ICD-10-CM | POA: Diagnosis not present

## 2023-05-09 DIAGNOSIS — E1140 Type 2 diabetes mellitus with diabetic neuropathy, unspecified: Secondary | ICD-10-CM | POA: Diagnosis not present

## 2023-05-09 DIAGNOSIS — K8689 Other specified diseases of pancreas: Secondary | ICD-10-CM | POA: Diagnosis not present

## 2023-05-09 DIAGNOSIS — C252 Malignant neoplasm of tail of pancreas: Secondary | ICD-10-CM | POA: Diagnosis not present

## 2023-05-09 DIAGNOSIS — E1165 Type 2 diabetes mellitus with hyperglycemia: Secondary | ICD-10-CM | POA: Diagnosis not present

## 2023-05-09 DIAGNOSIS — G4700 Insomnia, unspecified: Secondary | ICD-10-CM | POA: Diagnosis not present

## 2023-05-09 DIAGNOSIS — R1310 Dysphagia, unspecified: Secondary | ICD-10-CM | POA: Diagnosis not present

## 2023-05-09 DIAGNOSIS — K668 Other specified disorders of peritoneum: Secondary | ICD-10-CM | POA: Diagnosis not present

## 2023-05-31 DIAGNOSIS — Z1231 Encounter for screening mammogram for malignant neoplasm of breast: Secondary | ICD-10-CM | POA: Diagnosis not present

## 2023-05-31 DIAGNOSIS — Z0000 Encounter for general adult medical examination without abnormal findings: Secondary | ICD-10-CM | POA: Diagnosis not present

## 2023-05-31 DIAGNOSIS — Z1330 Encounter for screening examination for mental health and behavioral disorders, unspecified: Secondary | ICD-10-CM | POA: Diagnosis not present

## 2023-05-31 DIAGNOSIS — Z1331 Encounter for screening for depression: Secondary | ICD-10-CM | POA: Diagnosis not present

## 2023-06-15 DIAGNOSIS — Z23 Encounter for immunization: Secondary | ICD-10-CM | POA: Diagnosis not present

## 2023-06-15 DIAGNOSIS — T753XXA Motion sickness, initial encounter: Secondary | ICD-10-CM | POA: Diagnosis not present

## 2023-06-15 DIAGNOSIS — K219 Gastro-esophageal reflux disease without esophagitis: Secondary | ICD-10-CM | POA: Diagnosis not present

## 2023-06-15 DIAGNOSIS — R000 Tachycardia, unspecified: Secondary | ICD-10-CM | POA: Diagnosis not present

## 2023-07-18 DIAGNOSIS — C259 Malignant neoplasm of pancreas, unspecified: Secondary | ICD-10-CM | POA: Diagnosis not present

## 2023-07-18 DIAGNOSIS — R0609 Other forms of dyspnea: Secondary | ICD-10-CM | POA: Diagnosis not present

## 2023-07-18 DIAGNOSIS — K668 Other specified disorders of peritoneum: Secondary | ICD-10-CM | POA: Diagnosis not present

## 2023-08-09 DIAGNOSIS — M79675 Pain in left toe(s): Secondary | ICD-10-CM | POA: Diagnosis not present

## 2023-08-09 DIAGNOSIS — J029 Acute pharyngitis, unspecified: Secondary | ICD-10-CM | POA: Diagnosis not present

## 2023-08-09 DIAGNOSIS — R051 Acute cough: Secondary | ICD-10-CM | POA: Diagnosis not present

## 2023-09-04 DIAGNOSIS — E1165 Type 2 diabetes mellitus with hyperglycemia: Secondary | ICD-10-CM | POA: Diagnosis not present

## 2023-09-04 DIAGNOSIS — I10 Essential (primary) hypertension: Secondary | ICD-10-CM | POA: Diagnosis not present

## 2023-09-08 DIAGNOSIS — C259 Malignant neoplasm of pancreas, unspecified: Secondary | ICD-10-CM | POA: Diagnosis not present

## 2023-09-08 DIAGNOSIS — Z90411 Acquired partial absence of pancreas: Secondary | ICD-10-CM | POA: Diagnosis not present

## 2023-09-08 DIAGNOSIS — K668 Other specified disorders of peritoneum: Secondary | ICD-10-CM | POA: Diagnosis not present

## 2023-09-11 DIAGNOSIS — E1165 Type 2 diabetes mellitus with hyperglycemia: Secondary | ICD-10-CM | POA: Diagnosis not present

## 2023-09-11 DIAGNOSIS — D649 Anemia, unspecified: Secondary | ICD-10-CM | POA: Diagnosis not present

## 2023-09-11 DIAGNOSIS — C253 Malignant neoplasm of pancreatic duct: Secondary | ICD-10-CM | POA: Diagnosis not present

## 2023-09-11 DIAGNOSIS — R52 Pain, unspecified: Secondary | ICD-10-CM | POA: Diagnosis not present

## 2023-09-11 DIAGNOSIS — G4700 Insomnia, unspecified: Secondary | ICD-10-CM | POA: Diagnosis not present

## 2023-09-11 DIAGNOSIS — C772 Secondary and unspecified malignant neoplasm of intra-abdominal lymph nodes: Secondary | ICD-10-CM | POA: Diagnosis not present

## 2023-09-11 DIAGNOSIS — K8689 Other specified diseases of pancreas: Secondary | ICD-10-CM | POA: Diagnosis not present

## 2023-09-11 DIAGNOSIS — R110 Nausea: Secondary | ICD-10-CM | POA: Diagnosis not present

## 2023-09-11 DIAGNOSIS — R1310 Dysphagia, unspecified: Secondary | ICD-10-CM | POA: Diagnosis not present

## 2023-09-11 DIAGNOSIS — Z79899 Other long term (current) drug therapy: Secondary | ICD-10-CM | POA: Diagnosis not present

## 2023-09-11 DIAGNOSIS — T451X5A Adverse effect of antineoplastic and immunosuppressive drugs, initial encounter: Secondary | ICD-10-CM | POA: Diagnosis not present

## 2023-10-18 DIAGNOSIS — E1165 Type 2 diabetes mellitus with hyperglycemia: Secondary | ICD-10-CM | POA: Diagnosis not present

## 2023-11-06 ENCOUNTER — Other Ambulatory Visit: Payer: Self-pay | Admitting: Family Medicine

## 2023-11-06 DIAGNOSIS — Z1231 Encounter for screening mammogram for malignant neoplasm of breast: Secondary | ICD-10-CM

## 2023-11-15 ENCOUNTER — Ambulatory Visit
Admission: RE | Admit: 2023-11-15 | Discharge: 2023-11-15 | Disposition: A | Discharge status: Home / Self Care | Payer: Medicare Other | Source: Ambulatory Visit | Attending: Family Medicine | Admitting: Family Medicine

## 2023-11-15 DIAGNOSIS — Z1231 Encounter for screening mammogram for malignant neoplasm of breast: Secondary | ICD-10-CM | POA: Diagnosis present

## 2023-11-17 ENCOUNTER — Other Ambulatory Visit: Payer: Self-pay | Admitting: *Deleted

## 2023-11-17 ENCOUNTER — Inpatient Hospital Stay
Admission: RE | Admit: 2023-11-17 | Discharge: 2023-11-17 | Disposition: A | Discharge status: Home / Self Care | Payer: Self-pay | Source: Ambulatory Visit | Attending: Family Medicine | Admitting: Family Medicine

## 2023-11-17 DIAGNOSIS — Z1231 Encounter for screening mammogram for malignant neoplasm of breast: Secondary | ICD-10-CM

## 2024-06-15 ENCOUNTER — Other Ambulatory Visit: Payer: Self-pay

## 2024-06-15 ENCOUNTER — Encounter: Payer: Self-pay | Admitting: Emergency Medicine

## 2024-06-15 ENCOUNTER — Inpatient Hospital Stay
Admission: EM | Admit: 2024-06-15 | Discharge: 2024-06-18 | DRG: 637 | Disposition: A | Discharge status: Home Health | Attending: Internal Medicine | Admitting: Internal Medicine

## 2024-06-15 DIAGNOSIS — Z881 Allergy status to other antibiotic agents status: Secondary | ICD-10-CM

## 2024-06-15 DIAGNOSIS — E66811 Obesity, class 1: Secondary | ICD-10-CM | POA: Diagnosis present

## 2024-06-15 DIAGNOSIS — E669 Obesity, unspecified: Secondary | ICD-10-CM | POA: Insufficient documentation

## 2024-06-15 DIAGNOSIS — E1122 Type 2 diabetes mellitus with diabetic chronic kidney disease: Secondary | ICD-10-CM | POA: Diagnosis present

## 2024-06-15 DIAGNOSIS — D509 Iron deficiency anemia, unspecified: Secondary | ICD-10-CM | POA: Insufficient documentation

## 2024-06-15 DIAGNOSIS — G8929 Other chronic pain: Secondary | ICD-10-CM

## 2024-06-15 DIAGNOSIS — E1110 Type 2 diabetes mellitus with ketoacidosis without coma: Principal | ICD-10-CM

## 2024-06-15 DIAGNOSIS — G893 Neoplasm related pain (acute) (chronic): Secondary | ICD-10-CM | POA: Diagnosis present

## 2024-06-15 DIAGNOSIS — Z79899 Other long term (current) drug therapy: Secondary | ICD-10-CM

## 2024-06-15 DIAGNOSIS — E1100 Type 2 diabetes mellitus with hyperosmolarity without nonketotic hyperglycemic-hyperosmolar coma (NKHHC): Secondary | ICD-10-CM | POA: Diagnosis present

## 2024-06-15 DIAGNOSIS — G9341 Metabolic encephalopathy: Secondary | ICD-10-CM | POA: Diagnosis present

## 2024-06-15 DIAGNOSIS — R4182 Altered mental status, unspecified: Secondary | ICD-10-CM | POA: Diagnosis not present

## 2024-06-15 DIAGNOSIS — Z880 Allergy status to penicillin: Secondary | ICD-10-CM

## 2024-06-15 DIAGNOSIS — Z794 Long term (current) use of insulin: Secondary | ICD-10-CM

## 2024-06-15 DIAGNOSIS — D72829 Elevated white blood cell count, unspecified: Secondary | ICD-10-CM | POA: Diagnosis present

## 2024-06-15 DIAGNOSIS — I129 Hypertensive chronic kidney disease with stage 1 through stage 4 chronic kidney disease, or unspecified chronic kidney disease: Secondary | ICD-10-CM | POA: Diagnosis present

## 2024-06-15 DIAGNOSIS — Z91040 Latex allergy status: Secondary | ICD-10-CM

## 2024-06-15 DIAGNOSIS — F32A Depression, unspecified: Secondary | ICD-10-CM | POA: Diagnosis present

## 2024-06-15 DIAGNOSIS — E872 Acidosis, unspecified: Secondary | ICD-10-CM | POA: Diagnosis present

## 2024-06-15 DIAGNOSIS — G9340 Encephalopathy, unspecified: Secondary | ICD-10-CM

## 2024-06-15 DIAGNOSIS — Z7984 Long term (current) use of oral hypoglycemic drugs: Secondary | ICD-10-CM

## 2024-06-15 DIAGNOSIS — Z7952 Long term (current) use of systemic steroids: Secondary | ICD-10-CM

## 2024-06-15 DIAGNOSIS — B370 Candidal stomatitis: Secondary | ICD-10-CM | POA: Insufficient documentation

## 2024-06-15 DIAGNOSIS — C259 Malignant neoplasm of pancreas, unspecified: Secondary | ICD-10-CM | POA: Diagnosis present

## 2024-06-15 DIAGNOSIS — N179 Acute kidney failure, unspecified: Secondary | ICD-10-CM

## 2024-06-15 DIAGNOSIS — D631 Anemia in chronic kidney disease: Secondary | ICD-10-CM | POA: Diagnosis present

## 2024-06-15 DIAGNOSIS — I10 Essential (primary) hypertension: Secondary | ICD-10-CM | POA: Diagnosis present

## 2024-06-15 DIAGNOSIS — Z803 Family history of malignant neoplasm of breast: Secondary | ICD-10-CM

## 2024-06-15 DIAGNOSIS — F419 Anxiety disorder, unspecified: Secondary | ICD-10-CM | POA: Diagnosis present

## 2024-06-15 DIAGNOSIS — N1832 Chronic kidney disease, stage 3b: Secondary | ICD-10-CM | POA: Diagnosis present

## 2024-06-15 DIAGNOSIS — R040 Epistaxis: Secondary | ICD-10-CM | POA: Insufficient documentation

## 2024-06-15 DIAGNOSIS — Z6834 Body mass index (BMI) 34.0-34.9, adult: Secondary | ICD-10-CM

## 2024-06-15 NOTE — ED Triage Notes (Addendum)
 Patient's daughter reports patient fell tonight, did not hit her head, did not have LOC, but states that she has been disoriented since she fell.  Patient's daughter is unsure if she's had her insulin  or not.  Patient's daughter also reports tremors since yesterday.  Patient CAOx2, unable to follow commands for stroke screen. LKW 2300.

## 2024-06-16 ENCOUNTER — Emergency Department

## 2024-06-16 DIAGNOSIS — G893 Neoplasm related pain (acute) (chronic): Secondary | ICD-10-CM | POA: Diagnosis present

## 2024-06-16 DIAGNOSIS — R040 Epistaxis: Secondary | ICD-10-CM | POA: Diagnosis not present

## 2024-06-16 DIAGNOSIS — Z803 Family history of malignant neoplasm of breast: Secondary | ICD-10-CM | POA: Diagnosis not present

## 2024-06-16 DIAGNOSIS — Z6834 Body mass index (BMI) 34.0-34.9, adult: Secondary | ICD-10-CM | POA: Diagnosis not present

## 2024-06-16 DIAGNOSIS — Z7984 Long term (current) use of oral hypoglycemic drugs: Secondary | ICD-10-CM | POA: Diagnosis not present

## 2024-06-16 DIAGNOSIS — F32A Depression, unspecified: Secondary | ICD-10-CM | POA: Diagnosis present

## 2024-06-16 DIAGNOSIS — Z881 Allergy status to other antibiotic agents status: Secondary | ICD-10-CM | POA: Diagnosis not present

## 2024-06-16 DIAGNOSIS — B370 Candidal stomatitis: Secondary | ICD-10-CM | POA: Diagnosis not present

## 2024-06-16 DIAGNOSIS — N1832 Chronic kidney disease, stage 3b: Secondary | ICD-10-CM

## 2024-06-16 DIAGNOSIS — E872 Acidosis, unspecified: Secondary | ICD-10-CM

## 2024-06-16 DIAGNOSIS — C259 Malignant neoplasm of pancreas, unspecified: Secondary | ICD-10-CM

## 2024-06-16 DIAGNOSIS — N179 Acute kidney failure, unspecified: Secondary | ICD-10-CM | POA: Diagnosis present

## 2024-06-16 DIAGNOSIS — I10 Essential (primary) hypertension: Secondary | ICD-10-CM

## 2024-06-16 DIAGNOSIS — G9341 Metabolic encephalopathy: Secondary | ICD-10-CM | POA: Diagnosis present

## 2024-06-16 DIAGNOSIS — E1100 Type 2 diabetes mellitus with hyperosmolarity without nonketotic hyperglycemic-hyperosmolar coma (NKHHC): Secondary | ICD-10-CM | POA: Diagnosis present

## 2024-06-16 DIAGNOSIS — G8929 Other chronic pain: Secondary | ICD-10-CM

## 2024-06-16 DIAGNOSIS — Z91040 Latex allergy status: Secondary | ICD-10-CM | POA: Diagnosis not present

## 2024-06-16 DIAGNOSIS — D631 Anemia in chronic kidney disease: Secondary | ICD-10-CM | POA: Diagnosis present

## 2024-06-16 DIAGNOSIS — D72829 Elevated white blood cell count, unspecified: Secondary | ICD-10-CM | POA: Diagnosis present

## 2024-06-16 DIAGNOSIS — E669 Obesity, unspecified: Secondary | ICD-10-CM | POA: Insufficient documentation

## 2024-06-16 DIAGNOSIS — Z794 Long term (current) use of insulin: Secondary | ICD-10-CM | POA: Diagnosis not present

## 2024-06-16 DIAGNOSIS — I129 Hypertensive chronic kidney disease with stage 1 through stage 4 chronic kidney disease, or unspecified chronic kidney disease: Secondary | ICD-10-CM | POA: Diagnosis present

## 2024-06-16 DIAGNOSIS — F419 Anxiety disorder, unspecified: Secondary | ICD-10-CM | POA: Diagnosis present

## 2024-06-16 DIAGNOSIS — R4182 Altered mental status, unspecified: Secondary | ICD-10-CM | POA: Diagnosis present

## 2024-06-16 DIAGNOSIS — E66811 Obesity, class 1: Secondary | ICD-10-CM | POA: Diagnosis present

## 2024-06-16 DIAGNOSIS — E1122 Type 2 diabetes mellitus with diabetic chronic kidney disease: Secondary | ICD-10-CM | POA: Diagnosis present

## 2024-06-16 DIAGNOSIS — Z880 Allergy status to penicillin: Secondary | ICD-10-CM | POA: Diagnosis not present

## 2024-06-16 DIAGNOSIS — G894 Chronic pain syndrome: Secondary | ICD-10-CM

## 2024-06-16 DIAGNOSIS — Z79899 Other long term (current) drug therapy: Secondary | ICD-10-CM | POA: Diagnosis not present

## 2024-06-16 LAB — LIPASE, BLOOD: Lipase: 633 U/L — ABNORMAL HIGH (ref 11–51)

## 2024-06-16 LAB — COMPREHENSIVE METABOLIC PANEL WITH GFR
ALT: 9 U/L (ref 0–44)
AST: 22 U/L (ref 15–41)
Albumin: 3.7 g/dL (ref 3.5–5.0)
Alkaline Phosphatase: 102 U/L (ref 38–126)
Anion gap: 20 — ABNORMAL HIGH (ref 5–15)
BUN: 46 mg/dL — ABNORMAL HIGH (ref 6–20)
CO2: 27 mmol/L (ref 22–32)
Calcium: 9.9 mg/dL (ref 8.9–10.3)
Chloride: 83 mmol/L — ABNORMAL LOW (ref 98–111)
Creatinine, Ser: 3.11 mg/dL — ABNORMAL HIGH (ref 0.44–1.00)
GFR, Estimated: 17 mL/min — ABNORMAL LOW (ref >60)
Glucose, Bld: 780 mg/dL (ref 70–99)
Potassium: 5.2 mmol/L — ABNORMAL HIGH (ref 3.5–5.1)
Sodium: 130 mmol/L — ABNORMAL LOW (ref 135–145)
Total Bilirubin: 0.8 mg/dL (ref 0.0–1.2)
Total Protein: 7.7 g/dL (ref 6.5–8.1)

## 2024-06-16 LAB — URINALYSIS, ROUTINE W REFLEX MICROSCOPIC
Bilirubin Urine: NEGATIVE
Glucose, UA: >=500 mg/dL — AB
Hgb urine dipstick: NEGATIVE
Ketones, ur: NEGATIVE mg/dL
Leukocytes,Ua: NEGATIVE
Nitrite: NEGATIVE
Protein, ur: NEGATIVE mg/dL
Specific Gravity, Urine: 1.013 (ref 1.005–1.030)
pH: 6 (ref 5.0–8.0)

## 2024-06-16 LAB — CBG MONITORING, ED
Glucose-Capillary: 164 mg/dL — ABNORMAL HIGH (ref 70–99)
Glucose-Capillary: 178 mg/dL — ABNORMAL HIGH (ref 70–99)
Glucose-Capillary: 189 mg/dL — ABNORMAL HIGH (ref 70–99)
Glucose-Capillary: 190 mg/dL — ABNORMAL HIGH (ref 70–99)
Glucose-Capillary: 202 mg/dL — ABNORMAL HIGH (ref 70–99)
Glucose-Capillary: 209 mg/dL — ABNORMAL HIGH (ref 70–99)
Glucose-Capillary: 213 mg/dL — ABNORMAL HIGH (ref 70–99)
Glucose-Capillary: 231 mg/dL — ABNORMAL HIGH (ref 70–99)
Glucose-Capillary: 241 mg/dL — ABNORMAL HIGH (ref 70–99)
Glucose-Capillary: 248 mg/dL — ABNORMAL HIGH (ref 70–99)
Glucose-Capillary: 308 mg/dL — ABNORMAL HIGH (ref 70–99)
Glucose-Capillary: 358 mg/dL — ABNORMAL HIGH (ref 70–99)
Glucose-Capillary: 366 mg/dL — ABNORMAL HIGH (ref 70–99)
Glucose-Capillary: 372 mg/dL — ABNORMAL HIGH (ref 70–99)
Glucose-Capillary: 430 mg/dL — ABNORMAL HIGH (ref 70–99)
Glucose-Capillary: 437 mg/dL — ABNORMAL HIGH (ref 70–99)
Glucose-Capillary: 445 mg/dL — ABNORMAL HIGH (ref 70–99)
Glucose-Capillary: 523 mg/dL (ref 70–99)
Glucose-Capillary: >600 mg/dL (ref 70–99)
Glucose-Capillary: >600 mg/dL (ref 70–99)
Glucose-Capillary: >600 mg/dL (ref 70–99)

## 2024-06-16 LAB — BASIC METABOLIC PANEL WITH GFR
Anion gap: 13 (ref 5–15)
Anion gap: 17 — ABNORMAL HIGH (ref 5–15)
Anion gap: 15 (ref 5–15)
Anion gap: 12 (ref 5–15)
Anion gap: 11 (ref 5–15)
BUN: 41 mg/dL — ABNORMAL HIGH (ref 6–20)
BUN: 40 mg/dL — ABNORMAL HIGH (ref 6–20)
BUN: 38 mg/dL — ABNORMAL HIGH (ref 6–20)
BUN: 37 mg/dL — ABNORMAL HIGH (ref 6–20)
BUN: 32 mg/dL — ABNORMAL HIGH (ref 6–20)
CO2: 27 mmol/L (ref 22–32)
CO2: 29 mmol/L (ref 22–32)
CO2: 27 mmol/L (ref 22–32)
CO2: 29 mmol/L (ref 22–32)
CO2: 29 mmol/L (ref 22–32)
Calcium: 8.6 mg/dL — ABNORMAL LOW (ref 8.9–10.3)
Calcium: 9.4 mg/dL (ref 8.9–10.3)
Calcium: 8.9 mg/dL (ref 8.9–10.3)
Calcium: 9.1 mg/dL (ref 8.9–10.3)
Calcium: 8.9 mg/dL (ref 8.9–10.3)
Chloride: 93 mmol/L — ABNORMAL LOW (ref 98–111)
Chloride: 94 mmol/L — ABNORMAL LOW (ref 98–111)
Chloride: 95 mmol/L — ABNORMAL LOW (ref 98–111)
Chloride: 96 mmol/L — ABNORMAL LOW (ref 98–111)
Chloride: 98 mmol/L (ref 98–111)
Creatinine, Ser: 2.83 mg/dL — ABNORMAL HIGH (ref 0.44–1.00)
Creatinine, Ser: 2.49 mg/dL — ABNORMAL HIGH (ref 0.44–1.00)
Creatinine, Ser: 2.43 mg/dL — ABNORMAL HIGH (ref 0.44–1.00)
Creatinine, Ser: 2.13 mg/dL — ABNORMAL HIGH (ref 0.44–1.00)
Creatinine, Ser: 2.13 mg/dL — ABNORMAL HIGH (ref 0.44–1.00)
GFR, Estimated: 19 mL/min — ABNORMAL LOW (ref >60)
GFR, Estimated: 22 mL/min — ABNORMAL LOW (ref >60)
GFR, Estimated: 22 mL/min — ABNORMAL LOW (ref >60)
GFR, Estimated: 26 mL/min — ABNORMAL LOW (ref >60)
GFR, Estimated: 26 mL/min — ABNORMAL LOW (ref >60)
Glucose, Bld: 361 mg/dL — ABNORMAL HIGH (ref 70–99)
Glucose, Bld: 210 mg/dL — ABNORMAL HIGH (ref 70–99)
Glucose, Bld: 252 mg/dL — ABNORMAL HIGH (ref 70–99)
Glucose, Bld: 187 mg/dL — ABNORMAL HIGH (ref 70–99)
Glucose, Bld: 184 mg/dL — ABNORMAL HIGH (ref 70–99)
Potassium: 4 mmol/L (ref 3.5–5.1)
Potassium: 4.1 mmol/L (ref 3.5–5.1)
Potassium: 3.9 mmol/L (ref 3.5–5.1)
Potassium: 3.8 mmol/L (ref 3.5–5.1)
Potassium: 4 mmol/L (ref 3.5–5.1)
Sodium: 133 mmol/L — ABNORMAL LOW (ref 135–145)
Sodium: 140 mmol/L (ref 135–145)
Sodium: 137 mmol/L (ref 135–145)
Sodium: 137 mmol/L (ref 135–145)
Sodium: 138 mmol/L (ref 135–145)

## 2024-06-16 LAB — CBC WITH DIFFERENTIAL/PLATELET
Abs Immature Granulocytes: 0.06 K/uL (ref 0.00–0.07)
Basophils Absolute: 0.1 K/uL (ref 0.0–0.1)
Basophils Relative: 0 %
Eosinophils Absolute: 0.1 K/uL (ref 0.0–0.5)
Eosinophils Relative: 1 %
HCT: 32.8 % — ABNORMAL LOW (ref 36.0–46.0)
Hemoglobin: 10.6 g/dL — ABNORMAL LOW (ref 12.0–15.0)
Immature Granulocytes: 0 %
Lymphocytes Relative: 22 %
Lymphs Abs: 4 K/uL (ref 0.7–4.0)
MCH: 31.4 pg (ref 26.0–34.0)
MCHC: 32.3 g/dL (ref 30.0–36.0)
MCV: 97 fL (ref 80.0–100.0)
Monocytes Absolute: 1.2 K/uL — ABNORMAL HIGH (ref 0.1–1.0)
Monocytes Relative: 7 %
Neutro Abs: 12.6 K/uL — ABNORMAL HIGH (ref 1.7–7.7)
Neutrophils Relative %: 70 %
Platelets: 499 K/uL — ABNORMAL HIGH (ref 150–400)
RBC: 3.38 MIL/uL — ABNORMAL LOW (ref 3.87–5.11)
RDW: 16.1 % — ABNORMAL HIGH (ref 11.5–15.5)
WBC: 18.1 K/uL — ABNORMAL HIGH (ref 4.0–10.5)
nRBC: 0.1 % (ref 0.0–0.2)

## 2024-06-16 LAB — HEMOGLOBIN A1C
Hgb A1c MFr Bld: 9.7 % — ABNORMAL HIGH (ref 4.8–5.6)
Mean Plasma Glucose: 231.69 mg/dL

## 2024-06-16 LAB — CBC
HCT: 28.4 % — ABNORMAL LOW (ref 36.0–46.0)
Hemoglobin: 9.1 g/dL — ABNORMAL LOW (ref 12.0–15.0)
MCH: 31.4 pg (ref 26.0–34.0)
MCHC: 32 g/dL (ref 30.0–36.0)
MCV: 97.9 fL (ref 80.0–100.0)
Platelets: 460 K/uL — ABNORMAL HIGH (ref 150–400)
RBC: 2.9 MIL/uL — ABNORMAL LOW (ref 3.87–5.11)
RDW: 16 % — ABNORMAL HIGH (ref 11.5–15.5)
WBC: 15 K/uL — ABNORMAL HIGH (ref 4.0–10.5)
nRBC: 0 % (ref 0.0–0.2)

## 2024-06-16 LAB — GLUCOSE, CAPILLARY
Glucose-Capillary: 159 mg/dL — ABNORMAL HIGH (ref 70–99)
Glucose-Capillary: 166 mg/dL — ABNORMAL HIGH (ref 70–99)
Glucose-Capillary: 172 mg/dL — ABNORMAL HIGH (ref 70–99)

## 2024-06-16 LAB — MRSA NEXT GEN BY PCR, NASAL: MRSA by PCR Next Gen: NOT DETECTED

## 2024-06-16 LAB — BLOOD GAS, VENOUS
Acid-Base Excess: 6.1 mmol/L — ABNORMAL HIGH (ref 0.0–2.0)
Bicarbonate: 33.7 mmol/L — ABNORMAL HIGH (ref 20.0–28.0)
O2 Saturation: 53 %
Patient temperature: 37
pCO2, Ven: 61 mmHg — ABNORMAL HIGH (ref 44–60)
pH, Ven: 7.35 (ref 7.25–7.43)
pO2, Ven: 34 mmHg (ref 32–45)

## 2024-06-16 LAB — OSMOLALITY: Osmolality: 329 mosm/kg (ref 275–295)

## 2024-06-16 LAB — BETA-HYDROXYBUTYRIC ACID
Beta-Hydroxybutyric Acid: 0.19 mmol/L (ref 0.05–0.27)
Beta-Hydroxybutyric Acid: 0.96 mmol/L — ABNORMAL HIGH (ref 0.05–0.27)

## 2024-06-16 LAB — LACTIC ACID, PLASMA
Lactic Acid, Venous: 4.1 mmol/L (ref 0.5–1.9)
Lactic Acid, Venous: 5.4 mmol/L (ref 0.5–1.9)
Lactic Acid, Venous: 4.5 mmol/L (ref 0.5–1.9)

## 2024-06-16 LAB — HIV ANTIBODY (ROUTINE TESTING W REFLEX): HIV Screen 4th Generation wRfx: NONREACTIVE

## 2024-06-16 MED ORDER — INSULIN REGULAR(HUMAN) IN NACL 100-0.9 UT/100ML-% IV SOLN
INTRAVENOUS | Status: DC
  Administered 2024-06-16: 4.8 [IU]/h via INTRAVENOUS
  Administered 2024-06-16: 11 [IU]/h via INTRAVENOUS
  Filled 2024-06-16: qty 100

## 2024-06-16 MED ORDER — LACTATED RINGERS IV SOLN: INTRAVENOUS | Status: DC

## 2024-06-16 MED ORDER — INSULIN REGULAR(HUMAN) IN NACL 100-0.9 UT/100ML-% IV SOLN
INTRAVENOUS | Status: DC
  Administered 2024-06-16: 11 [IU]/h via INTRAVENOUS
  Filled 2024-06-16: qty 100

## 2024-06-16 MED ORDER — SODIUM CHLORIDE 0.9 % IV SOLN
2.0000 g | INTRAVENOUS | Status: DC
  Administered 2024-06-16 – 2024-06-17 (×2): 2 g via INTRAVENOUS
  Filled 2024-06-16 (×4): qty 20

## 2024-06-16 MED ORDER — SODIUM CHLORIDE 0.9 % IV BOLUS
1000.0000 mL | Freq: Once | INTRAVENOUS | Status: AC
  Administered 2024-06-16: 1000 mL via INTRAVENOUS

## 2024-06-16 MED ORDER — MORPHINE SULFATE ER 15 MG PO TBCR
15.0000 mg | EXTENDED_RELEASE_TABLET | Freq: Two times a day (BID) | ORAL | Status: DC
  Administered 2024-06-16: 15 mg via ORAL
  Filled 2024-06-16: qty 1

## 2024-06-16 MED ORDER — LACTATED RINGERS IV BOLUS
1000.0000 mL | Freq: Once | INTRAVENOUS | Status: AC
  Administered 2024-06-16: 1000 mL via INTRAVENOUS

## 2024-06-16 MED ORDER — DEXTROSE IN LACTATED RINGERS 5 % IV SOLN: INTRAVENOUS | Status: DC

## 2024-06-16 MED ORDER — AZITHROMYCIN 500 MG PO TABS
500.0000 mg | ORAL_TABLET | Freq: Every day | ORAL | Status: AC
  Administered 2024-06-16: 500 mg via ORAL
  Filled 2024-06-16: qty 1

## 2024-06-16 MED ORDER — HYDROXYZINE HCL 25 MG PO TABS
25.0000 mg | ORAL_TABLET | Freq: Three times a day (TID) | ORAL | Status: DC | PRN
Start: 2024-06-16 — End: 2024-06-18

## 2024-06-16 MED ORDER — ENOXAPARIN SODIUM 30 MG/0.3ML IJ SOSY: 30.0000 mg | PREFILLED_SYRINGE | INTRAMUSCULAR | Status: DC

## 2024-06-16 MED ORDER — PANTOPRAZOLE SODIUM 40 MG PO TBEC
40.0000 mg | DELAYED_RELEASE_TABLET | Freq: Every day | ORAL | Status: DC
  Administered 2024-06-16 – 2024-06-18 (×3): 40 mg via ORAL
  Filled 2024-06-16 (×3): qty 1

## 2024-06-16 MED ORDER — DEXTROSE 50 % IV SOLN: 0.0000 mL | INTRAVENOUS | Status: DC | PRN

## 2024-06-16 MED ORDER — AZITHROMYCIN 250 MG PO TABS
250.0000 mg | ORAL_TABLET | Freq: Every day | ORAL | Status: DC
  Administered 2024-06-17 – 2024-06-18 (×2): 250 mg via ORAL
  Filled 2024-06-16 (×2): qty 1

## 2024-06-16 MED ORDER — TEMAZEPAM 7.5 MG PO CAPS: 15.0000 mg | ORAL_CAPSULE | Freq: Every evening | ORAL | Status: DC | PRN

## 2024-06-16 MED ORDER — POTASSIUM CHLORIDE CRYS ER 20 MEQ PO TBCR
40.0000 meq | EXTENDED_RELEASE_TABLET | Freq: Once | ORAL | Status: AC
  Administered 2024-06-16: 40 meq via ORAL
  Filled 2024-06-16: qty 2

## 2024-06-16 MED ORDER — OLANZAPINE 5 MG PO TABS
5.0000 mg | ORAL_TABLET | Freq: Every day | ORAL | Status: DC
  Administered 2024-06-16 – 2024-06-17 (×2): 5 mg via ORAL
  Filled 2024-06-16 (×2): qty 1

## 2024-06-16 MED ORDER — APIXABAN 5 MG PO TABS
5.0000 mg | ORAL_TABLET | Freq: Two times a day (BID) | ORAL | Status: DC
  Administered 2024-06-16 – 2024-06-18 (×5): 5 mg via ORAL
  Filled 2024-06-16 (×5): qty 1

## 2024-06-16 MED ORDER — LACTATED RINGERS IV BOLUS: 20.0000 mL/kg | Freq: Once | INTRAVENOUS | Status: DC

## 2024-06-16 MED ORDER — LACTATED RINGERS IV BOLUS (SEPSIS)
1000.0000 mL | Freq: Once | INTRAVENOUS | Status: AC
  Administered 2024-06-16: 1000 mL via INTRAVENOUS

## 2024-06-16 MED ORDER — DULOXETINE HCL 30 MG PO CPEP
30.0000 mg | ORAL_CAPSULE | Freq: Every day | ORAL | Status: DC
  Administered 2024-06-16 – 2024-06-18 (×3): 30 mg via ORAL
  Filled 2024-06-16 (×3): qty 1

## 2024-06-16 MED ORDER — OXYCODONE HCL 5 MG PO TABS
5.0000 mg | ORAL_TABLET | ORAL | Status: DC | PRN
  Administered 2024-06-18: 5 mg via ORAL
  Filled 2024-06-16: qty 1

## 2024-06-16 NOTE — Assessment & Plan Note (Addendum)
 Patient came off insulin  drip.  Will increase long-acting insulin  to 40 units at night and 5 units of short acting prior to meals plus sliding scale.

## 2024-06-16 NOTE — ED Notes (Signed)
 Hospitalist Wieting at bedside

## 2024-06-16 NOTE — ED Notes (Signed)
 Patient has port, requests port accessed for blood draw.

## 2024-06-16 NOTE — ED Notes (Addendum)
 Placed purewick on the pt, family at the bedside,fall risk bundle in place, comfortable and resting at this time, no further needs.

## 2024-06-16 NOTE — Assessment & Plan Note (Addendum)
 Blood pressure starting to rise restarted Norvasc  this morning and we will restart Toprol .  If blood pressure at home is greater than 160/90 can restart olmesartan

## 2024-06-16 NOTE — Hospital Course (Addendum)
 63 y.o. 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 Recent hospitalized with DKA at [facility] 7/1-04/04/24.She presented with altered mental status, weakness resulting in a fall.  History provided by daughter at bedside who lives with her.  She states she saw her mother's bedroom light on and when she went to check on her she saw that she had fallen.  Mother appeared a bit confused when found.  She had no apparent injury and they were able to help her up.  She was able to ambulate with assistance to the car.  According to the daughter she was in her usual state of health before going to bed.  She did appear to be shaking a bit which they thought was due to having the windows open.  She has had no recent nausea, vomiting abdominal pain, cough, fever or chills, chest pain, change in bowel habits or dysuria. In the ED, tachycardic to 113 with otherwise normal vitals. Labs notable for blood glucose 780 with anion gap of 20 but with normal bicarb of 27.  Beta hydroxybutyric acid 0.96 and venous pH 7.35. WBC 18,000 and lactic acid 4.1 Creatinine 3.11(was 1.4 on 7/15 but has been uptrending) Urinalysis pending. EKG showed sinus at 96 CT head nonacute Chest x-ray clear Patient treated with LR boluses and started on an insulin  infusion Admission requested   9/14.  Sugars trending better.  Still on insulin  drip.  Will start empiric antibiotic.  Reordered short acting pain medication. 9/15.  Patient feeling better.  Creatinine improved down to 1.4.  Patient off insulin  drip and will increase Lantus  insulin  to her usual dose of 40 units at night 9/16.  Patient has blood pressure starting to come up.  Restarted Toprol  and Norvasc .

## 2024-06-16 NOTE — H&P (Signed)
 History and Physical    PatientSofiya Barry FMW:[id number] DOB: 1961-07-23 DOA: 06/15/2024 DOS: the patient was seen and examined on 06/16/2024 PCP: Quin Damien Maier, PA  Patient coming from: Home  Chief Complaint:  Chief Complaint  Patient presents with   Fall   Altered Mental Status    HPI: Regina Barry is a 63 y.o. 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 Recent hospitalized with DKA at [facility] 7/1-04/04/24.She presented with altered mental status, weakness resulting in a fall.  History provided by daughter at bedside who lives with her.  She states she saw her mother's bedroom light on and when she went to check on her she saw that she had fallen.  Mother appeared a bit confused when found.  She had no apparent injury and they were able to help her up.  She was able to ambulate with assistance to the car.  According to the daughter she was in her usual state of health before going to bed.  She did appear to be shaking a bit which they thought was due to having the windows open.  She has had no recent nausea, vomiting abdominal pain, cough, fever or chills, chest pain, change in bowel habits or dysuria. In the ED, tachycardic to 113 with otherwise normal vitals. Labs notable for blood glucose 780 with anion gap of 20 but with normal bicarb of 27.  Beta hydroxybutyric acid 0.96 and venous pH 7.35. WBC 18,000 and lactic acid 4.1 Creatinine 3.11(was 1.4 on 7/15 but has been uptrending) Urinalysis pending. EKG showed sinus at 96 CT head nonacute Chest x-ray clear Patient treated with LR boluses and started on an insulin  infusion Admission requested     Past Medical History:  Diagnosis Date   Diabetes mellitus without complication (HCC)    Hypertension    History reviewed. No pertinent surgical  history. Social History:  reports that she has never smoked. She has never used smokeless tobacco. She reports that she does not currently use alcohol. No history on file for drug use.  Allergies  Allergen Reactions   Diflucortolone    Doxycycline    Latex    Penicillins     Family History  Problem Relation Age of Onset   Breast cancer Mother 79   Breast cancer Paternal Aunt    Breast cancer Cousin     Prior to Admission medications   Medication Sig Start Date End Date Taking? Authorizing Provider  acetaminophen  (TYLENOL ) 325 MG suppository Place 650 mg rectally every 6 (six) hours as needed for mild pain or fever.    [provider]  amLODipine  (NORVASC ) 10 MG tablet Take 10 mg by mouth daily.    [provider]  Continuous Glucose Receiver (FREESTYLE LIBRE 14 DAY READER) DEVI by Does not apply route.    [provider]  dexamethasone (DECADRON) 4 MG tablet Take 4 mg by mouth daily. As directed by oncology    [provider]  DULoxetine  (CYMBALTA ) 30 MG capsule Take 30 mg by mouth daily.    [provider]  esomeprazole (NEXIUM) 40 MG capsule Take 40 mg by mouth daily at 12 noon.    [provider]  ezetimibe (ZETIA) 10 MG tablet Take 10 mg by mouth daily.    [provider]  Ferrous Fumarate (HEMOCYTE - 106 MG FE) 324 (106 Fe) MG  TABS tablet Take 1 tablet by mouth every other day.    [provider]  furosemide (LASIX) 40 MG tablet Take 40 mg by mouth daily.    [provider]  glipiZIDE (GLUCOTROL XL) 10 MG 24 hr tablet Take 10 mg by mouth daily with breakfast.    [provider]  hydrOXYzine  (ATARAX ) 25 MG tablet Take 25 mg by mouth every 8 (eight) hours as needed for itching.    [provider]  insulin  glargine (LANTUS ) 100 UNIT/ML injection Inject 25 Units into the skin daily.    [provider]  insulin  lispro (HUMALOG) 100 UNIT/ML injection Inject 10-30 Units into the  skin 3 (three) times daily before meals. Inject 10 units with breakfast, lunch, and dinner plus correction 2 units per 50 above 200. Max TDD: 60 units    [provider]  lidocaine (XYLOCAINE) 2 % solution Use as directed 15 mLs in the mouth or throat 4 (four) times daily as needed for mouth pain.    [provider]  metFORMIN (GLUCOPHAGE) 500 MG tablet Take 1,000 mg by mouth 2 (two) times daily with a meal.    [provider]  metoprolol  succinate (TOPROL -XL) 100 MG 24 hr tablet Take 100 mg by mouth daily. Take with or immediately following a meal.    [provider]  naproxen (NAPROSYN) 500 MG tablet Take 500 mg by mouth 2 (two) times daily with a meal.    [provider]  OLANZapine  (ZYPREXA ) 5 MG tablet Take 5 mg by mouth at bedtime.    [provider]  olmesartan (BENICAR) 40 MG tablet Take 40 mg by mouth daily.    [provider]  ondansetron  (ZOFRAN ) 8 MG tablet Take 8 mg by mouth every 12 (twelve) hours as needed for nausea or vomiting.    [provider]  oxyCODONE  (OXY IR/ROXICODONE ) 5 MG immediate release tablet Take 5 mg by mouth every 4 (four) hours as needed for severe pain.    [provider]  Pancrelipase, Lip-Prot-Amyl, (ZENPEP) 25000-79000 units CPEP Take 2-3 capsules by mouth 3 (three) times daily before meals. 3 caps AC, 2 before snacks    [provider]  potassium chloride  (MICRO-K ) 10 MEQ CR capsule Take 10 mEq by mouth daily.    [provider]  prochlorperazine (COMPAZINE) 10 MG tablet Take 10 mg by mouth every 6 (six) hours as needed for nausea or vomiting.    [provider]  spironolactone (ALDACTONE) 25 MG tablet Take 25 mg by mouth daily.    [provider]  temazepam  (RESTORIL ) 15 MG capsule Take 15 mg by mouth at bedtime as needed for sleep.    [provider]    Physical Exam: Vitals:   06/16/24 0051 06/16/24 0052 06/16/24 0053 06/16/24 0054   BP:      Pulse: 92 91 91 91  Resp: 17 12 16 20   Temp:      SpO2: 98% 98% 98% 98%  Weight:       Physical Exam Vitals and nursing note reviewed.  Constitutional:      General: She is sleeping. She is not in acute distress. HENT:     Head: Normocephalic and atraumatic.  Cardiovascular:     Rate and Rhythm: Normal rate and regular rhythm.     Heart sounds: Normal heart sounds.  Pulmonary:     Effort: Pulmonary effort is normal.     Breath sounds: Normal breath sounds.  Abdominal:  Palpations: Abdomen is soft.     Tenderness: There is no abdominal tenderness.  Neurological:     General: No focal deficit present.     Mental Status: She is easily aroused. She is lethargic.     Labs on Admission: I have personally reviewed following labs and imaging studies  CBC: Recent Labs  Lab 06/16/24 0029  WBC 18.1*  NEUTROABS 12.6*  HGB 10.6*  HCT 32.8*  MCV 97.0  PLT 499*   Basic Metabolic Panel: Recent Labs  Lab 06/16/24 0029  NA 130*  K 5.2*  CL 83*  CO2 27  GLUCOSE 780*  BUN 46*  CREATININE 3.11*  CALCIUM 9.9   GFR: CrCl cannot be calculated (Unknown ideal weight.). Liver Function Tests: Recent Labs  Lab 06/16/24 0029  AST 22  ALT 9  ALKPHOS 102  BILITOT 0.8  PROT 7.7  ALBUMIN  3.7   No results for input(s): LIPASE, AMYLASE in the last 168 hours. No results for input(s): AMMONIA in the last 168 hours. Coagulation Profile: No results for input(s): INR, PROTIME in the last 168 hours. Cardiac Enzymes: No results for input(s): CKTOTAL, CKMB, CKMBINDEX, TROPONINI in the last 168 hours. BNP (last 3 results) No results for input(s): PROBNP in the last 8760 hours. HbA1C: No results for input(s): HGBA1C in the last 72 hours. CBG: Recent Labs  Lab 06/15/24 2359  GLUCAP >600*   Lipid Profile: No results for input(s): CHOL, HDL, LDLCALC, TRIG, CHOLHDL, LDLDIRECT in the last 72 hours. Thyroid Function Tests: No results  for input(s): TSH, T4TOTAL, FREET4, T3FREE, THYROIDAB in the last 72 hours. Anemia Panel: No results for input(s): VITAMINB12, FOLATE, FERRITIN, TIBC, IRON, RETICCTPCT in the last 72 hours. Urine analysis:    Component Value Date/Time   COLORURINE STRAW (A) 03/14/2023 2122   APPEARANCEUR CLEAR (A) 03/14/2023 2122   APPEARANCEUR Clear 06/09/2012 1650   LABSPEC 1.028 03/14/2023 2122   LABSPEC 1.005 06/09/2012 1650   PHURINE 5.0 03/14/2023 2122   GLUCOSEU >=500 (A) 03/14/2023 2122   GLUCOSEU Negative 06/09/2012 1650   HGBUR NEGATIVE 03/14/2023 2122   BILIRUBINUR NEGATIVE 03/14/2023 2122   BILIRUBINUR Negative 06/09/2012 1650   KETONESUR 5 (A) 03/14/2023 2122   PROTEINUR NEGATIVE 03/14/2023 2122   NITRITE NEGATIVE 03/14/2023 2122   LEUKOCYTESUR NEGATIVE 03/14/2023 2122   LEUKOCYTESUR Negative 06/09/2012 1650    Radiological Exams on Admission: CT Head Wo Contrast Result Date: 06/16/2024 CLINICAL DATA:  Mental status change, unknown cause EXAM: CT HEAD WITHOUT CONTRAST TECHNIQUE: Contiguous axial images were obtained from the base of the skull through the vertex without intravenous contrast. RADIATION DOSE REDUCTION: This exam was performed according to the departmental dose-optimization program which includes automated exposure control, adjustment of the mA and/or kV according to patient size and/or use of iterative reconstruction technique. COMPARISON:  CT head 03/14/2023 FINDINGS: Brain: No evidence of large-territorial acute infarction. No parenchymal hemorrhage. No mass lesion. No extra-axial collection. No mass effect or midline shift. No hydrocephalus. Basilar cisterns are patent. Vascular: No hyperdense vessel. Atherosclerotic calcifications are present within the cavernous internal carotid arteries. Skull: No acute fracture or focal lesion. Sinuses/Orbits: Paranasal sinuses and mastoid air cells are clear. The orbits are unremarkable. Other: None. IMPRESSION: No  acute intracranial abnormality. Electronically Signed   By: Morgane  Naveau M.D.   On: 06/16/2024 01:37   DG Chest Port 1 View Result Date: 06/16/2024 EXAM: 1 VIEW XRAY OF THE CHEST 06/16/2024 12:39:46 AM COMPARISON: None available. CLINICAL HISTORY: Questionable sepsis - evaluate for abnormality.  PER ER NOTE; Patient's daughter reports patient fell tonight, did not hit her head, did not have LOC, but states that she has been disoriented since she fell. Patient's daughter is unsure if she's had her insulin  or not. Patient's daughter also reports tremors since yesterday. Patient CAOx2, unable to follow commands for stroke screen. LKW 2300. FINDINGS: LINES, TUBES AND DEVICES: Right chest port in place with tip in mid SVC. LUNGS AND PLEURA: Low lung volumes. No focal pulmonary opacity. No pulmonary edema. No pleural effusion. No pneumothorax. HEART AND MEDIASTINUM: No acute abnormality of the cardiac and mediastinal silhouettes. BONES AND SOFT TISSUES: Elevated right hemidiaphragm. No acute osseous abnormality. IMPRESSION: 1. No acute findings. 2. Low lung volumes and elevated right hemidiaphragm. Electronically signed by: Dorethia Molt MD 06/16/2024 12:53 AM EDT RP Workstation: [id number]   Data Reviewed for HPI: Relevant notes from primary care and specialist visits, past discharge summaries as available in EHR, including Care Everywhere. Prior diagnostic testing as pertinent to current admission diagnoses Updated medications and problem lists for reconciliation ED course, including vitals, labs, imaging, treatment and response to treatment Triage notes, nursing and pharmacy notes and ED provider's notes Notable results as noted above in HPI      Assessment and Plan: * Hyperosmolar hyperglycemic state (HHS) (HCC) IV hydration, insulin  infusion, potassium repletion per Endo tool Transition to subcu insulin  when appropriate  Acute metabolic encephalopathy Suspect secondary to HHS/DKA CT head  nonacute.  Neuroexam nonfocal Neurologic checks with aspiration precautions and fall precautions  Lactic acidosis Leukocytosis Sepsis not suspected at this time WBC 18,000 and lactic acid 4.1 without other stigmata of infection Suspecting related to hypoglycemia, as per prior presentations, notably admission at [facility] 2024 Holding off on antibiotics for now Follow [facility]A   Acute renal failure superimposed on stage 3b chronic kidney disease (HCC) Expecting improvement to baseline with IV fluid resuscitation Avoid nephrotoxins  Acinar cell carcinoma of pancreas (HCC) Chronic pain Followed at [facility] Minimize narcotics if able given altered mental status  Obesity, Class III, BMI 40-49.9 (morbid obesity) Complicating factor to overall prognosis and care  Essential hypertension BP soft so we will hold antihypertensive     DVT prophylaxis: Lovenox   Consults: none  Advance Care Planning:   Code Status: Prior   Family Communication: none  Disposition Plan: Back to previous home environment  Severity of Illness: The appropriate patient status for this patient is INPATIENT. Inpatient status is judged to be reasonable and necessary in order to provide the required intensity of service to ensure the patient's safety. The patient's presenting symptoms, physical exam findings, and initial radiographic and laboratory data in the context of their chronic comorbidities is felt to place them at high risk for further clinical deterioration. Furthermore, it is not anticipated that the patient will be medically stable for discharge from the hospital within 2 midnights of admission.   * I certify that at the point of admission it is my clinical judgment that the patient will require inpatient hospital care spanning beyond 2 midnights from the point of admission due to high intensity of service, high risk for further deterioration and high frequency of surveillance required.*  Author: Delayne LULLA Solian,  MD 06/16/2024 2:29 AM  For on call review [email or web address].

## 2024-06-16 NOTE — Progress Notes (Signed)
 Progress Note   Patient: Regina Barry FMW:[id number] DOB: [birth year] DOA: 06/15/2024     0 DOS: the patient was seen and examined on 06/16/2024   Brief hospital course: 63 y.o. 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 Recent hospitalized with DKA at [facility] 7/1-04/04/24.She presented with altered mental status, weakness resulting in a fall.  History provided by daughter at bedside who lives with her.  She states she saw her mother's bedroom light on and when she went to check on her she saw that she had fallen.  Mother appeared a bit confused when found.  She had no apparent injury and they were able to help her up.  She was able to ambulate with assistance to the car.  According to the daughter she was in her usual state of health before going to bed.  She did appear to be shaking a bit which they thought was due to having the windows open.  She has had no recent nausea, vomiting abdominal pain, cough, fever or chills, chest pain, change in bowel habits or dysuria. In the ED, tachycardic to 113 with otherwise normal vitals. Labs notable for blood glucose 780 with anion gap of 20 but with normal bicarb of 27.  Beta hydroxybutyric acid 0.96 and venous pH 7.35. WBC 18,000 and lactic acid 4.1 Creatinine 3.11(was 1.4 on 7/15 but has been uptrending) Urinalysis pending. EKG showed sinus at 96 CT head nonacute Chest x-ray clear Patient treated with LR boluses and started on an insulin  infusion Admission requested   9/14.  Sugars trending better.  Still on insulin  drip.  Will start empiric antibiotic.  Reordered short acting pain medication.  Assessment and Plan: * Hyperosmolar hyperglycemic state (HHS) (HCC) IV hydration, insulin  infusion.  Sugars trending into the 200s.  Continue to monitor.  Hopefully can convert off insulin  drip  today.  Acute metabolic encephalopathy Suspect secondary to HHS/DKA CT head nonacute.    Lactic acidosis Leukocytosis Will start empiric antibiotics.  Repeat chest x-ray tomorrow.  Could be secondary to metformin.   Acute renal failure superimposed on stage 3b chronic kidney disease (HCC) Creatinine 3.11 on presentation down to 2.49.  Continue IV fluids.  Acinar cell carcinoma of pancreas (HCC) Chronic pain Restart as needed short acting pain meds  Obesity (BMI 30-39.9) With current height and weight in computer BMI 32.17 which is class I obesity  Essential hypertension Holding blood pressure medication        Subjective: Patient feels okay.  Offers some complaints of pain.  Not feeling well for a few weeks.  Admitted with very high sugar  Physical Exam: Vitals:   06/16/24 1000 06/16/24 1030 06/16/24 1100 06/16/24 1130  BP: (!) 112/59 128/60 (!) 143/76 (!) 103/47  Pulse: 84 79 92 90  Resp: 17 17 (!) 21 (!) 21  Temp:      TempSrc:      SpO2: 98% 97% 93% 95%  Weight:      Height:       Physical Exam HENT:     Head: Normocephalic.  Eyes:     General: Lids are normal.  Cardiovascular:     Rate and Rhythm: Normal rate and regular rhythm.     Heart sounds: Normal heart sounds, S1 normal and S2 normal.  Pulmonary:     Breath sounds: No decreased breath sounds, wheezing, rhonchi or rales.  Abdominal:     Palpations: Abdomen is soft.     Tenderness: There is no abdominal tenderness.  Musculoskeletal:     Right lower leg: No swelling.     Left lower leg: No swelling.  Skin:    General: Skin is warm.     Findings: No rash.  Neurological:     Mental Status: She is alert.     Data Reviewed: Creatinine 2.49, glucose 210, lactic acid 4.5, white blood count 15, hemoglobin 9.1, platelet count 460  Family Communication: Spoke with daughter on the phone  Disposition: Status is: Inpatient Remains inpatient appropriate because: Continue to see when we can take off  insulin  drip.  Start empiric Rocephin  and Zithromax .  Planned Discharge Destination: To be determined    Time spent: 30 minutes  Author: Charlie Patterson, MD 06/16/2024 12:01 PM  For on call review [email or web address].

## 2024-06-16 NOTE — Assessment & Plan Note (Addendum)
 Creatinine 3.11 on presentation down to 1.89.  Looks like last month had a creatinine around 2.1.

## 2024-06-16 NOTE — ED Provider Notes (Signed)
 [facility] Provider Note    Event Date/Time   First MD Initiated Contact with Patient 06/16/24 0001     (approximate)   History   Fall and Altered Mental Status  Level 5 caveat:  history/ROS limited by acute/critical illness  HPI Regina Barry is a 63 y.o. female with history of insulin -dependent diabetes who presents for evaluation of altered mental status.  Her daughter reports that she lives with her and she was alerted that the patient had fallen, although apparently she just sort of sat herself down when she had gotten up to go to the bathroom.  She was weak and had a difficult time getting back to bed.  The patient's daughter said that she was confused but making more sense at home but after she got to the emergency department she started making less sense and is less responsive.  The patient told her daughter that she did not feel right but she was not sure what was wrong with her.  The daughter thinks that the patient did not take her insulin  tonight and this has happened in the past where she was in a diabetic coma because she did not take her insulin  and then got very confused.  Of note, the patient has stage IV pancreatic cancer according to the patient's daughter.  Last known normal was approximately 4 hours and 20 minutes prior to my assessment.     Physical Exam   Triage Vital Signs: ED Triage Vitals [06/15/24 2358]  Encounter Vitals Group     BP 111/81     Girls Systolic BP Percentile      Girls Diastolic BP Percentile      Boys Systolic BP Percentile      Boys Diastolic BP Percentile      Pulse Rate (!) 113     Resp 18     Temp 98.1 F (36.7 C)     Temp src      SpO2 90 %     Weight 85 kg (187 lb 6.3 oz)     Height      Head Circumference      Peak Flow      Pain Score 0     Pain Loc      Pain Education      Exclude from Growth Chart     Most recent vital signs: Vitals:   06/16/24 0230 06/16/24 0400  BP: 125/64    Pulse: 65   Resp: 16   Temp:  98.2 F (36.8 C)  SpO2: 100%     General: Awake, will respond to some questions but not following commands consistently.  Pupils are equal and reactive. CV:  Good peripheral perfusion.  Regular rate and rhythm, normal heart sounds. Resp:  Normal effort. Speaking easily and comfortably, no accessory muscle usage nor intercostal retractions.  Lungs are clear to auscultation bilaterally. Abd:  Obese.  No tenderness to palpation of the abdomen. Other:  With great encouragement, the patient will squeeze my hand with each of hers and raise her legs up off the bed.  However she is too altered to reliably follow commands and engage in an extensive neuroexam.   ED Results / Procedures / Treatments   Labs (all labs ordered are listed, but only abnormal results are displayed) Labs Reviewed  LACTIC ACID, PLASMA - Abnormal; Notable for the following components:      Result Value   Lactic Acid, Venous 4.1 (*)    All other  components within normal limits  COMPREHENSIVE METABOLIC PANEL WITH GFR - Abnormal; Notable for the following components:   Sodium 130 (*)    Potassium 5.2 (*)    Chloride 83 (*)    Glucose, Bld 780 (*)    BUN 46 (*)    Creatinine, Ser 3.11 (*)    GFR, Estimated 17 (*)    Anion gap 20 (*)    All other components within normal limits  CBC WITH DIFFERENTIAL/PLATELET - Abnormal; Notable for the following components:   WBC 18.1 (*)    RBC 3.38 (*)    Hemoglobin 10.6 (*)    HCT 32.8 (*)    RDW 16.1 (*)    Platelets 499 (*)    Neutro Abs 12.6 (*)    Monocytes Absolute 1.2 (*)    All other components within normal limits  BETA-HYDROXYBUTYRIC ACID - Abnormal; Notable for the following components:   Beta-Hydroxybutyric Acid 0.96 (*)    All other components within normal limits  BLOOD GAS, VENOUS - Abnormal; Notable for the following components:   pCO2, Ven 61 (*)    Bicarbonate 33.7 (*)    Acid-Base Excess 6.1 (*)    All other components  within normal limits  LIPASE, BLOOD - Abnormal; Notable for the following components:   Lipase 633 (*)    All other components within normal limits  CBG MONITORING, ED - Abnormal; Notable for the following components:   Glucose-Capillary >600 (*)    All other components within normal limits  CBG MONITORING, ED - Abnormal; Notable for the following components:   Glucose-Capillary >600 (*)    All other components within normal limits  CBG MONITORING, ED - Abnormal; Notable for the following components:   Glucose-Capillary >600 (*)    All other components within normal limits  LACTIC ACID, PLASMA  URINALYSIS, ROUTINE W REFLEX MICROSCOPIC  HIV ANTIBODY (ROUTINE TESTING W REFLEX)  OSMOLALITY  CBC  LACTIC ACID, PLASMA  BASIC METABOLIC PANEL WITH GFR  BASIC METABOLIC PANEL WITH GFR  BASIC METABOLIC PANEL WITH GFR  BASIC METABOLIC PANEL WITH GFR  BASIC METABOLIC PANEL WITH GFR  HEMOGLOBIN A1C     EKG  ED ECG REPORT I, Darleene Dome, the attending physician, personally viewed and interpreted this ECG.  Date: 06/16/2024 EKG Time: 00: 15 Rate: 96 Rhythm: normal sinus rhythm QRS Axis: normal Intervals: normal ST/T Wave abnormalities: normal Narrative Interpretation: no evidence of acute ischemia    RADIOLOGY I independently viewed and interpreted the patient's CT head and I see no evidence of intracranial bleed or obvious CVA.  I also read the radiologist's report, which confirmed no acute findings.  I independently viewed and interpreted the patient's chest x-ray.  I see no evidence of pneumonia.  I also read the radiologist's report, which confirmed no acute findings.   PROCEDURES:  Critical Care performed: Yes, see critical care procedure note(s)  .Critical Care  Performed by: Dome Darleene, MD Authorized by: Dome Darleene, MD   Critical care provider statement:    Critical care time (minutes):  30   Critical care time was exclusive of:  Separately billable  procedures and treating other patients   Critical care was time spent personally by me on the following activities:  Development of treatment plan with patient or surrogate, evaluation of patient's response to treatment, examination of patient, obtaining history from patient or surrogate, ordering and performing treatments and interventions, ordering and review of laboratory studies, ordering and review of radiographic studies, pulse oximetry,  re-evaluation of patient's condition and review of old charts .1-3 Lead EKG Interpretation  Performed by: Gordan Huxley, MD Authorized by: Gordan Huxley, MD     Interpretation: normal     ECG rate:  65   ECG rate assessment: normal     Rhythm: sinus rhythm     Ectopy: none     Conduction: normal       IMPRESSION / MDM / ASSESSMENT AND PLAN / ED COURSE  I reviewed the triage vital signs and the nursing notes.                              Differential diagnosis includes, but is not limited to, DKA, HHS, acute intracranial bleed, CVA  Patient's presentation is most consistent with acute presentation with potential threat to life or bodily function.  Labs/studies ordered: CT head, chest x-ray, CMP, lipase, CBC with differential, lactic acid, beta hydroxybutyric acid, VBG, EKG  Interventions/Medications given:  Medications  dextrose  50 % solution 0-50 mL (has no administration in time range)  enoxaparin  (LOVENOX ) injection 30 mg (has no administration in time range)  insulin  regular, human (MYXREDLIN ) 100 units/ 100 mL infusion (8 Units/hr Intravenous Rate/Dose Change 06/16/24 0351)  lactated ringers  infusion ( Intravenous New Bag/Given 06/16/24 0322)  dextrose  5 % in lactated ringers  infusion (0 mLs Intravenous Hold 06/16/24 0250)  sodium chloride  0.9 % bolus 1,000 mL (0 mLs Intravenous Hold 06/16/24 0235)  lactated ringers  bolus 1,000 mL (0 mLs Intravenous Stopped 06/16/24 0213)  lactated ringers  bolus 1,000 mL (0 mLs Intravenous Stopped 06/16/24  0322)    (Note:  hospital course my include additional interventions and/or labs/studies not listed above.)   Vitals are stable but patient is quite altered.  I talked with the daughter and under the circumstances, given no objective evidence to suggest CVA, I think it is much more likely that the patient is suffering from DKA or HHS rather than acute CVA.  There is no indication for me to activate code stroke upon my initial assessment.  CBG was greater than 600.  Additional labs reveal leukocytosis of 18 but no evidence of acute infection and I will not treat empirically with antibiotics.  CMP is notable for glucose of 780 and acute renal failure with creatinine of 3.11.  Potassium is 5.2.  Anion gap is 20.  Beta hydroxybutyric acid is elevated at 0.96.  I will treat with IV insulin  per protocol for DKA although the VBG is reassuring with no clear acidosis on the VBG.  May be more of an HHS presentation but either way the patient requires IV insulin , and I also ordered 2 L of LR for initial fluid bolus.  Continue with protocol and will consult the hospitalist for admission.    The patient is on the cardiac monitor to evaluate for evidence of arrhythmia and/or significant heart rate changes.   Clinical Course as of 06/16/24 0409  Sun Jun 16, 2024  0035 Lactic Acid, Venous(!!): 4.1 The patient is noted to have a lactate>4. With the current information available to me, I don't think the patient is in septic shock. The lactate>4, is related to DKA  [CF]  0131 I consulted by phone with the admitting hospitalist, and they will admit the patient - Dr. Cleatus [CF]    Clinical Course User Index [CF] Gordan Huxley, MD     FINAL CLINICAL IMPRESSION(S) / ED DIAGNOSES   Final diagnoses:  Diabetic ketoacidosis without  coma associated with type 2 diabetes mellitus (HCC)  Acute encephalopathy  Acute renal failure, unspecified acute renal failure type (HCC)     Rx / DC Orders   ED Discharge  Orders     None        Note:  This document was prepared using Dragon voice recognition software and may include unintentional dictation errors.   Gordan Huxley, MD 06/16/24 [phone]

## 2024-06-16 NOTE — Assessment & Plan Note (Addendum)
 Suspect secondary to HHS/DKA CT head nonacute.   Seems improved today

## 2024-06-16 NOTE — Assessment & Plan Note (Addendum)
 With current height and weight in computer BMI 34.89 which is class I obesity

## 2024-06-16 NOTE — Assessment & Plan Note (Deleted)
 Complicating factor to overall prognosis and care

## 2024-06-16 NOTE — Assessment & Plan Note (Addendum)
 Leukocytosis On empiric antibiotic.  Repeat chest x-ray negative.  Could be secondary to metformin

## 2024-06-16 NOTE — Assessment & Plan Note (Addendum)
 Chronic pain Short acting pain medications Discontinue long-acting pain medication

## 2024-06-17 ENCOUNTER — Inpatient Hospital Stay

## 2024-06-17 DIAGNOSIS — E1100 Type 2 diabetes mellitus with hyperosmolarity without nonketotic hyperglycemic-hyperosmolar coma (NKHHC): Secondary | ICD-10-CM | POA: Diagnosis not present

## 2024-06-17 DIAGNOSIS — B370 Candidal stomatitis: Secondary | ICD-10-CM | POA: Insufficient documentation

## 2024-06-17 DIAGNOSIS — G9341 Metabolic encephalopathy: Secondary | ICD-10-CM | POA: Diagnosis not present

## 2024-06-17 DIAGNOSIS — E872 Acidosis, unspecified: Secondary | ICD-10-CM | POA: Diagnosis not present

## 2024-06-17 DIAGNOSIS — N179 Acute kidney failure, unspecified: Secondary | ICD-10-CM | POA: Diagnosis not present

## 2024-06-17 LAB — CBC
HCT: 26.9 % — ABNORMAL LOW (ref 36.0–46.0)
Hemoglobin: 8.8 g/dL — ABNORMAL LOW (ref 12.0–15.0)
MCH: 31.8 pg (ref 26.0–34.0)
MCHC: 32.7 g/dL (ref 30.0–36.0)
MCV: 97.1 fL (ref 80.0–100.0)
Platelets: 449 K/uL — ABNORMAL HIGH (ref 150–400)
RBC: 2.77 MIL/uL — ABNORMAL LOW (ref 3.87–5.11)
RDW: 16.2 % — ABNORMAL HIGH (ref 11.5–15.5)
WBC: 15.1 K/uL — ABNORMAL HIGH (ref 4.0–10.5)
nRBC: 0 % (ref 0.0–0.2)

## 2024-06-17 LAB — GLUCOSE, CAPILLARY
Glucose-Capillary: 153 mg/dL — ABNORMAL HIGH (ref 70–99)
Glucose-Capillary: 154 mg/dL — ABNORMAL HIGH (ref 70–99)
Glucose-Capillary: 219 mg/dL — ABNORMAL HIGH (ref 70–99)
Glucose-Capillary: 221 mg/dL — ABNORMAL HIGH (ref 70–99)
Glucose-Capillary: 229 mg/dL — ABNORMAL HIGH (ref 70–99)
Glucose-Capillary: 260 mg/dL — ABNORMAL HIGH (ref 70–99)
Glucose-Capillary: 350 mg/dL — ABNORMAL HIGH (ref 70–99)

## 2024-06-17 LAB — BASIC METABOLIC PANEL WITH GFR
Anion gap: 9 (ref 5–15)
BUN: 27 mg/dL — ABNORMAL HIGH (ref 6–20)
CO2: 28 mmol/L (ref 22–32)
Calcium: 9 mg/dL (ref 8.9–10.3)
Chloride: 101 mmol/L (ref 98–111)
Creatinine, Ser: 1.84 mg/dL — ABNORMAL HIGH (ref 0.44–1.00)
GFR, Estimated: 31 mL/min — ABNORMAL LOW (ref >60)
Glucose, Bld: 254 mg/dL — ABNORMAL HIGH (ref 70–99)
Potassium: 4.3 mmol/L (ref 3.5–5.1)
Sodium: 138 mmol/L (ref 135–145)

## 2024-06-17 MED ORDER — INSULIN GLARGINE 100 UNIT/ML ~~LOC~~ SOLN
20.0000 [IU] | Freq: Every day | SUBCUTANEOUS | Status: DC
  Administered 2024-06-17: 20 [IU] via SUBCUTANEOUS
  Filled 2024-06-17: qty 0.2

## 2024-06-17 MED ORDER — OXYMETAZOLINE HCL 0.05 % NA SOLN: 4.0000 | NASAL | Status: DC | PRN

## 2024-06-17 MED ORDER — PANCRELIPASE (LIP-PROT-AMYL) 36000-114000 UNITS PO CPEP
108000.0000 [IU] | ORAL_CAPSULE | Freq: Three times a day (TID) | ORAL | Status: DC
  Administered 2024-06-17 – 2024-06-18 (×2): 108000 [IU] via ORAL
  Filled 2024-06-17 (×2): qty 3

## 2024-06-17 MED ORDER — PANCRELIPASE (LIP-PROT-AMYL) 25000-79000 UNITS PO CPEP: 2.0000 | ORAL_CAPSULE | Freq: Three times a day (TID) | ORAL | Status: DC

## 2024-06-17 MED ORDER — NYSTATIN 100000 UNIT/ML MT SUSP
5.0000 mL | Freq: Four times a day (QID) | OROMUCOSAL | Status: DC
  Administered 2024-06-17 – 2024-06-18 (×5): 500000 [IU] via ORAL
  Filled 2024-06-17 (×8): qty 5

## 2024-06-17 MED ORDER — PANCRELIPASE (LIP-PROT-AMYL) 36000-114000 UNITS PO CPEP: 72000.0000 [IU] | ORAL_CAPSULE | ORAL | Status: DC | PRN

## 2024-06-17 MED ORDER — INSULIN GLARGINE 100 UNIT/ML ~~LOC~~ SOLN: 34.0000 [IU] | Freq: Every day | SUBCUTANEOUS | Status: DC

## 2024-06-17 MED ORDER — SODIUM CHLORIDE 0.9 % IV BOLUS
500.0000 mL | Freq: Once | INTRAVENOUS | Status: AC
  Administered 2024-06-17: 500 mL via INTRAVENOUS

## 2024-06-17 MED ORDER — INSULIN GLARGINE 100 UNIT/ML ~~LOC~~ SOLN
40.0000 [IU] | Freq: Every day | SUBCUTANEOUS | Status: DC
  Administered 2024-06-17: 40 [IU] via SUBCUTANEOUS
  Filled 2024-06-17: qty 0.4

## 2024-06-17 MED ORDER — INSULIN ASPART 100 UNIT/ML IJ SOLN
5.0000 [IU] | Freq: Three times a day (TID) | INTRAMUSCULAR | Status: DC
  Administered 2024-06-17 – 2024-06-18 (×3): 5 [IU] via SUBCUTANEOUS
  Filled 2024-06-17 (×3): qty 1

## 2024-06-17 MED ORDER — INSULIN ASPART 100 UNIT/ML IJ SOLN
0.0000 [IU] | Freq: Every day | INTRAMUSCULAR | Status: DC
  Administered 2024-06-17: 2 [IU] via SUBCUTANEOUS
  Filled 2024-06-17: qty 1

## 2024-06-17 MED ORDER — INSULIN ASPART 100 UNIT/ML IJ SOLN
0.0000 [IU] | Freq: Three times a day (TID) | INTRAMUSCULAR | Status: DC
  Administered 2024-06-17: 15 [IU] via SUBCUTANEOUS
  Administered 2024-06-17: 7 [IU] via SUBCUTANEOUS
  Administered 2024-06-17: 11 [IU] via SUBCUTANEOUS
  Administered 2024-06-18: 7 [IU] via SUBCUTANEOUS
  Filled 2024-06-17 (×4): qty 1

## 2024-06-17 NOTE — Inpatient Diabetes Management (Signed)
 Inpatient Diabetes Program Recommendations  AACE/ADA: New Consensus Statement on Inpatient Glycemic Control (2015)  Target Ranges:  Prepandial:   less than 140 mg/dL      Peak postprandial:   less than 180 mg/dL (1-2 hours)      Critically ill patients:  140 - 180 mg/dL   Lab Results  Component Value Date   GLUCAP 260 (H) 06/17/2024   HGBA1C 9.7 (H) 06/16/2024    Latest Reference Range & Units 06/17/24 00:52 06/17/24 02:42 06/17/24 07:31  Glucose-Capillary 70 - 99 mg/dL 845 (H) 846 (H) 739 (H)  (H): Data is abnormally high  Diabetes history: DM2 Outpatient Diabetes medications: Lantus  40 units daily, Humalog 10 units tid plus 2 units for every 50 >200, Glucotrol 10 mg daily, Metformin 1 gm bid, Decadron 4 mg daily Current orders for Inpatient glycemic control: Lantus  20 units nightly Novolog  0-20 units tid, 0-5 units hs  Inpatient Diabetes Program Recommendations:   May consider: -Increase Lantus  to 40 units daily -Add Novolog  5 units tid meal coverage if eats 50% meal   Thank you, Dagoberto E. Niquan Charnley, RN, MSN, CNS, CDCES  Diabetes Coordinator Inpatient Glycemic Control Team Team Pager [phone] (8am-5pm) 06/17/2024 10:42 AM

## 2024-06-17 NOTE — Progress Notes (Signed)
 OT Cancellation Note  Patient Details Name: Regina Barry MRN: [id number] DOB: Aug 13, 1961   Cancelled Treatment:    Reason Eval/Treat Not Completed: Patient at procedure or test/ unavailable. Chart reviewed. Attempted x2, on AM attempt pt working with PT, PM attempt pt sleeping soundly. Will follow and reattempt as available.   Elston Slot, M.S. OTR/L  06/17/24, 4:03 PM  ascom [phone]

## 2024-06-17 NOTE — Plan of Care (Signed)
  Problem: Education: Goal: Ability to describe self-care measures that may prevent or decrease complications (Diabetes Survival Skills Education) will improve Outcome: Progressing Goal: Individualized Educational Video(s) Outcome: Progressing   Problem: Education: Goal: Ability to describe self-care measures that may prevent or decrease complications (Diabetes Survival Skills Education) will improve Outcome: Progressing Goal: Individualized Educational Video(s) Outcome: Progressing   Problem: Coping: Goal: Ability to adjust to condition or change in health will improve Outcome: Progressing   Problem: Fluid Volume: Goal: Ability to maintain a balanced intake and output will improve Outcome: Progressing   Problem: Health Behavior/Discharge Planning: Goal: Ability to identify and utilize available resources and services will improve Outcome: Progressing Goal: Ability to manage health-related needs will improve Outcome: Progressing   Problem: Metabolic: Goal: Ability to maintain appropriate glucose levels will improve Outcome: Progressing   Problem: Nutritional: Goal: Maintenance of adequate nutrition will improve Outcome: Progressing Goal: Progress toward achieving an optimal weight will improve Outcome: Progressing   Problem: Skin Integrity: Goal: Risk for impaired skin integrity will decrease Outcome: Progressing   Problem: Tissue Perfusion: Goal: Adequacy of tissue perfusion will improve Outcome: Progressing   Problem: Education: Goal: Ability to describe self-care measures that may prevent or decrease complications (Diabetes Survival Skills Education) will improve Outcome: Progressing Goal: Individualized Educational Video(s) Outcome: Progressing   Problem: Cardiac: Goal: Ability to maintain an adequate cardiac output will improve Outcome: Progressing   Problem: Health Behavior/Discharge Planning: Goal: Ability to identify and utilize available resources and  services will improve Outcome: Progressing Goal: Ability to manage health-related needs will improve Outcome: Progressing   Problem: Fluid Volume: Goal: Ability to achieve a balanced intake and output will improve Outcome: Progressing   Problem: Metabolic: Goal: Ability to maintain appropriate glucose levels will improve Outcome: Progressing   Problem: Nutritional: Goal: Maintenance of adequate nutrition will improve Outcome: Progressing Goal: Maintenance of adequate weight for body size and type will improve Outcome: Progressing   Problem: Respiratory: Goal: Will regain and/or maintain adequate ventilation Outcome: Progressing   Problem: Urinary Elimination: Goal: Ability to achieve and maintain adequate renal perfusion and functioning will improve Outcome: Progressing   Problem: Education: Goal: Knowledge of General Education information will improve Description: Including pain rating scale, medication(s)/side effects and non-pharmacologic comfort measures Outcome: Progressing   Problem: Health Behavior/Discharge Planning: Goal: Ability to manage health-related needs will improve Outcome: Progressing   Problem: Clinical Measurements: Goal: Ability to maintain clinical measurements within normal limits will improve Outcome: Progressing Goal: Will remain free from infection Outcome: Progressing Goal: Diagnostic test results will improve Outcome: Progressing Goal: Respiratory complications will improve Outcome: Progressing Goal: Cardiovascular complication will be avoided Outcome: Progressing   Problem: Activity: Goal: Risk for activity intolerance will decrease Outcome: Progressing   Problem: Nutrition: Goal: Adequate nutrition will be maintained Outcome: Progressing   Problem: Coping: Goal: Level of anxiety will decrease Outcome: Progressing   Problem: Elimination: Goal: Will not experience complications related to bowel motility Outcome:  Progressing Goal: Will not experience complications related to urinary retention Outcome: Progressing   Problem: Pain Managment: Goal: General experience of comfort will improve and/or be controlled Outcome: Progressing   Problem: Safety: Goal: Ability to remain free from injury will improve Outcome: Progressing   Problem: Skin Integrity: Goal: Risk for impaired skin integrity will decrease Outcome: Progressing

## 2024-06-17 NOTE — Assessment & Plan Note (Signed)
Nystatin swish and swallow

## 2024-06-17 NOTE — Progress Notes (Signed)
 Progress Note   Patient: Regina Barry FMW:[id number] DOB: 1961/01/14 DOA: 06/15/2024     1 DOS: the patient was seen and examined on 06/17/2024   Brief hospital course: 63 y.o. 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 Recent hospitalized with DKA at [facility] 7/1-04/04/24.She presented with altered mental status, weakness resulting in a fall.  History provided by daughter at bedside who lives with her.  She states she saw her mother's bedroom light on and when she went to check on her she saw that she had fallen.  Mother appeared a bit confused when found.  She had no apparent injury and they were able to help her up.  She was able to ambulate with assistance to the car.  According to the daughter she was in her usual state of health before going to bed.  She did appear to be shaking a bit which they thought was due to having the windows open.  She has had no recent nausea, vomiting abdominal pain, cough, fever or chills, chest pain, change in bowel habits or dysuria. In the ED, tachycardic to 113 with otherwise normal vitals. Labs notable for blood glucose 780 with anion gap of 20 but with normal bicarb of 27.  Beta hydroxybutyric acid 0.96 and venous pH 7.35. WBC 18,000 and lactic acid 4.1 Creatinine 3.11(was 1.4 on 7/15 but has been uptrending) Urinalysis pending. EKG showed sinus at 96 CT head nonacute Chest x-ray clear Patient treated with LR boluses and started on an insulin  infusion Admission requested   9/14.  Sugars trending better.  Still on insulin  drip.  Will start empiric antibiotic.  Reordered short acting pain medication.  Assessment and Plan: * Hyperosmolar hyperglycemic state (HHS) (HCC) Patient came off insulin  drip.  Will increase long-acting insulin  to 40 units at night and 5 units of short acting prior to meals  plus sliding scale.  Acute metabolic encephalopathy Suspect secondary to HHS/DKA CT head nonacute.   Seems improved today  Lactic acidosis Leukocytosis On empiric antibiotic.  Repeat chest x-ray negative.  Could be secondary to metformin   Acute renal failure superimposed on stage 3b chronic kidney disease (HCC) Creatinine 3.11 on presentation down to 1.84.  Looks like last month had a creatinine around 2.1.  Acinar cell carcinoma of pancreas (HCC) Chronic pain Short acting pain medications  Thrush Nystatin  swish and swallow.  Obesity (BMI 30-39.9) With current height and weight in computer BMI 34.89 which is class I obesity  Essential hypertension Holding blood pressure medication        Subjective: Patient did not recognize me from yesterday.  Was admitted with elevated sugars and started on insulin  drip.  Physical Exam: Vitals:   06/17/24 0600 06/17/24 0700 06/17/24 0800 06/17/24 0958  BP:  (!) 156/78 (!) 143/59 (!) 150/76  Pulse: 100 97 95 100  Resp: (!) 0 (!) 0 (!) 5 16  Temp:    98.6 F (37 C)  TempSrc:      SpO2: 97% 97% 96% 96%  Weight:      Height:       Physical Exam HENT:     Head: Normocephalic.     Mouth/Throat:     Comments: Positive for thrush Eyes:     General: Lids are normal.  Cardiovascular:     Rate and Rhythm: Normal rate and regular rhythm.  Heart sounds: Normal heart sounds, S1 normal and S2 normal.  Pulmonary:     Breath sounds: No decreased breath sounds, wheezing, rhonchi or rales.  Abdominal:     Palpations: Abdomen is soft.     Tenderness: There is no abdominal tenderness.  Musculoskeletal:     Right lower leg: No swelling.     Left lower leg: No swelling.  Skin:    General: Skin is warm.     Findings: No rash.  Neurological:     Mental Status: She is alert.     Data Reviewed: Creatinine 1.84, white blood cell count 15.1, hemoglobin 8.8, platelet count 440  Family Communication: Spoke with daughter on the  phone.  Disposition: Status is: Inpatient Remains inpatient appropriate because: Titrate up insulin  this evening.  Off insulin  drip since last evening.  Planned Discharge Destination: Home with Home Health    Time spent: 28 minutes  Author: Charlie Patterson, MD 06/17/2024 12:11 PM  For on call review [email or web address].

## 2024-06-17 NOTE — Evaluation (Signed)
 Physical Therapy Evaluation Patient Details Name: Regina Barry MRN: [id number] DOB: [birth year] Today's Date: 06/17/2024  History of Present Illness  Regina Barry is a 63 y.o. 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 She presented with altered mental status, weakness resulting in a fall.  Clinical Impression  Pt is a pleasant 63 year old female who was admitted for AMS and weakness resulting in a fall. Prior to hospitalization, pt was independent with ambulation and ADLs. She lives with her mother and daughter who are available PRN, if needed.  Pt performs bed mobility with modified independence. She is able to go from supine>sitting at EOB with increased time and without need for physical assistance. Pt requires min A for transfers and CGA for ambulation. Pt required verbal cuing for hand placement to stand from EOB. SpO2 and HR monitored throughout session. Pt on 2L of O2 at beginning of PT session and SPT able to ween pt to RA post-session. SpO2 stayed WNL while ambulating. HR increased to 117bpm at its highest. Pt required multiple standing rest breaks to recover. SPT provided verbal cues through PLB to recover/ Pt demonstrates deficits with strength/balance/activity tolerance. Would benefit from skilled PT to address above deficits and promote optimal return to PLOF.          If plan is discharge home, recommend the following: A little help with walking and/or transfers;A little help with bathing/dressing/bathroom;Assistance with cooking/housework;Help with stairs or ramp for entrance   Can travel by private vehicle        Equipment Recommendations Rolling walker (2 wheels)  Recommendations for Other Services       Functional Status Assessment Patient has had a recent decline in their functional status and  demonstrates the ability to make significant improvements in function in a reasonable and predictable amount of time.     Precautions / Restrictions Precautions Precautions: Fall Recall of Precautions/Restrictions: Impaired Restrictions Weight Bearing Restrictions Per Provider Order: No      Mobility  Bed Mobility Overal bed mobility: Modified Independent             General bed mobility comments: Able to move from supine>sit with increased time, but independently.    Transfers Overall transfer level: Needs assistance Equipment used: Rolling walker (2 wheels) Transfers: Sit to/from Stand, Bed to chair/wheelchair/BSC Sit to Stand: Min assist Stand pivot transfers: Contact guard assist         General transfer comment: Verbal cues for hand placement. Min A for STS from EOB.    Ambulation/Gait Ambulation/Gait assistance: Contact guard assist Gait Distance (Feet): 80 Feet Assistive device: Rolling walker (2 wheels) Gait Pattern/deviations: Step-through pattern Gait velocity: dec     General Gait Details: No LOB with ambulation. Increased HR with activity.  Stairs            Wheelchair Mobility     Tilt Bed    Modified Rankin (Stroke Patients Only)       Balance Overall balance assessment: Needs assistance Sitting-balance support: Feet supported, No upper extremity supported Sitting balance-Leahy Scale: Normal Sitting balance - Comments: Able to maintain seated balance at EOB. No postural lean observed. Able to scoot self to EOB   Standing balance support: Bilateral upper extremity supported, During functional activity, Reliant on assistive device for balance Standing balance-Leahy Scale: Fair Standing balance comment: Reliant on RW for balance. Verbal cues  for hand placement for STS from EOB                             Pertinent Vitals/Pain Pain Assessment Pain Assessment: 0-10 Pain Score: 2  Pain Location: head Pain Descriptors /  Indicators: Headache Pain Intervention(s): Monitored during session    Home Living Family/patient expects to be discharged to:: Private residence Living Arrangements: Children;Parent Available Help at Discharge: Family;Available PRN/intermittently (Mother unable to help physically. Daughter available PRN) Type of Home: House Home Access: Stairs to enter Entrance Stairs-Rails: Right Entrance Stairs-Number of Steps: 3-4   Home Layout: One level Home Equipment: None      Prior Function Prior Level of Function : Independent/Modified Independent             Mobility Comments: No use of AD for mobility. Pt reports 0 falls in the past 6months aside from the fall related to this hospitalization ADLs Comments: Ind with ADLs.     Extremity/Trunk Assessment   Upper Extremity Assessment Upper Extremity Assessment: Overall WFL for tasks assessed    Lower Extremity Assessment Lower Extremity Assessment: Generalized weakness    Cervical / Trunk Assessment Cervical / Trunk Assessment: Normal  Communication   Communication Communication: No apparent difficulties    Cognition Arousal: Alert Behavior During Therapy: WFL for tasks assessed/performed   PT - Cognitive impairments: History of cognitive impairments                       PT - Cognition Comments: Pt is A&Ox4. Pleasant and agreeable to PT session. Following commands: Impaired Following commands impaired: Follows one step commands with increased time, Follows one step commands inconsistently     Cueing Cueing Techniques: Verbal cues     General Comments General comments (skin integrity, edema, etc.): Vitals monitored throughout session. SpO2 decreased to 88% on RA at the lowest. Verbal cues for PLB and standing rest break to recover. Highest HR recorded was 117bpm.    Exercises     Assessment/Plan    PT Assessment Patient needs continued PT services  PT Problem List Decreased strength;Decreased activity  tolerance;Decreased balance;Decreased mobility;Decreased cognition;Cardiopulmonary status limiting activity       PT Treatment Interventions DME instruction;Gait training;Stair training;Functional mobility training;Therapeutic activities;Therapeutic exercise;Balance training;Patient/family education    PT Goals (Current goals can be found in the Care Plan section)  Acute Rehab PT Goals Patient Stated Goal: to move better PT Goal Formulation: With patient Time For Goal Achievement: 07/01/24 Potential to Achieve Goals: Good    Frequency Min 1X/week     Co-evaluation               AM-PAC PT 6 Clicks Mobility  Outcome Measure Help needed turning from your back to your side while in a flat bed without using bedrails?: A Little Help needed moving from lying on your back to sitting on the side of a flat bed without using bedrails?: A Little Help needed moving to and from a bed to a chair (including a wheelchair)?: A Little Help needed standing up from a chair using your arms (e.g., wheelchair or bedside chair)?: A Little Help needed to walk in hospital room?: A Little Help needed climbing 3-5 steps with a railing? : A Lot 6 Click Score: 17    End of Session Equipment Utilized During Treatment: Gait belt Activity Tolerance: Patient tolerated treatment well Patient left: in chair;with call bell/phone within reach;with chair alarm  set Nurse Communication: Mobility status PT Visit Diagnosis: Unsteadiness on feet (R26.81);History of falling (Z91.81)    Time: 8668-8648 PT Time Calculation (min) (ACUTE ONLY): 20 min   Charges:                 Damone Fancher, SPT   Makaiah Terwilliger 06/17/2024, 3:07 PM

## 2024-06-18 DIAGNOSIS — D509 Iron deficiency anemia, unspecified: Secondary | ICD-10-CM | POA: Insufficient documentation

## 2024-06-18 DIAGNOSIS — N179 Acute kidney failure, unspecified: Secondary | ICD-10-CM | POA: Diagnosis not present

## 2024-06-18 DIAGNOSIS — D508 Other iron deficiency anemias: Secondary | ICD-10-CM

## 2024-06-18 DIAGNOSIS — E1100 Type 2 diabetes mellitus with hyperosmolarity without nonketotic hyperglycemic-hyperosmolar coma (NKHHC): Secondary | ICD-10-CM | POA: Diagnosis not present

## 2024-06-18 DIAGNOSIS — G9341 Metabolic encephalopathy: Secondary | ICD-10-CM | POA: Diagnosis not present

## 2024-06-18 DIAGNOSIS — R040 Epistaxis: Secondary | ICD-10-CM | POA: Insufficient documentation

## 2024-06-18 DIAGNOSIS — E872 Acidosis, unspecified: Secondary | ICD-10-CM | POA: Diagnosis not present

## 2024-06-18 LAB — BASIC METABOLIC PANEL WITH GFR
Anion gap: 13 (ref 5–15)
BUN: 20 mg/dL (ref 6–20)
CO2: 23 mmol/L (ref 22–32)
Calcium: 9.2 mg/dL (ref 8.9–10.3)
Chloride: 103 mmol/L (ref 98–111)
Creatinine, Ser: 1.89 mg/dL — ABNORMAL HIGH (ref 0.44–1.00)
GFR, Estimated: 30 mL/min — ABNORMAL LOW (ref >60)
Glucose, Bld: 245 mg/dL — ABNORMAL HIGH (ref 70–99)
Potassium: 4.2 mmol/L (ref 3.5–5.1)
Sodium: 139 mmol/L (ref 135–145)

## 2024-06-18 LAB — GLUCOSE, CAPILLARY: Glucose-Capillary: 224 mg/dL — ABNORMAL HIGH (ref 70–99)

## 2024-06-18 MED ORDER — AMLODIPINE BESYLATE 10 MG PO TABS
10.0000 mg | ORAL_TABLET | Freq: Every day | ORAL | Status: DC
  Administered 2024-06-18: 10 mg via ORAL
  Filled 2024-06-18: qty 1

## 2024-06-18 MED ORDER — AMLODIPINE BESYLATE 10 MG PO TABS: 10.0000 mg | ORAL_TABLET | Freq: Every day | ORAL | Status: DC

## 2024-06-18 MED ORDER — NYSTATIN 100000 UNIT/ML MT SUSP: 5.0000 mL | Freq: Four times a day (QID) | OROMUCOSAL | 0 refills | Status: DC

## 2024-06-18 MED ORDER — METOPROLOL SUCCINATE ER 50 MG PO TB24: 100.0000 mg | ORAL_TABLET | Freq: Every day | ORAL | Status: DC

## 2024-06-18 MED ORDER — OXYMETAZOLINE HCL 0.05 % NA SOLN: 4.0000 | NASAL | 0 refills | Status: AC | PRN

## 2024-06-18 MED ORDER — NYSTATIN 100000 UNIT/ML MT SUSP: 5.0000 mL | Freq: Four times a day (QID) | OROMUCOSAL | 0 refills | Status: AC

## 2024-06-18 NOTE — Progress Notes (Addendum)
 D/C AVS completed and reviewed with pt. All opportunities for questions answered and clarified. IV removed. Pt will be wheeled down to car at medical mall entrance via wheelchair.  1108 D/c barrier TOC needs to see. Set up home health

## 2024-06-18 NOTE — Discharge Summary (Signed)
 Physician Discharge Summary   Patient: Regina Barry MRN: [id number] DOB: [birth year]  Admit date:     06/15/2024  Discharge date: 06/18/24  Discharge Physician: Charlie Patterson   PCP: Patient, No Pcp Per   Recommendations at discharge:   Follow-up with your medical doctor  Discharge Diagnoses: Principal Problem:   Hyperosmolar hyperglycemic state (HHS) (HCC) Active Problems:   Acute metabolic encephalopathy   Lactic acidosis   Acute renal failure superimposed on stage 3b chronic kidney disease (HCC)   Acinar cell carcinoma of pancreas (HCC)   Chronic pain   Essential hypertension   Obesity (BMI 30-39.9)   Thrush   Iron deficiency anemia   Epistaxis    Hospital Course: 63 y.o. 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 Recent hospitalized with DKA at [facility] 7/1-04/04/24.She presented with altered mental status, weakness resulting in a fall.  History provided by daughter at bedside who lives with her.  She states she saw her mother's bedroom light on and when she went to check on her she saw that she had fallen.  Mother appeared a bit confused when found.  She had no apparent injury and they were able to help her up.  She was able to ambulate with assistance to the car.  According to the daughter she was in her usual state of health before going to bed.  She did appear to be shaking a bit which they thought was due to having the windows open.  She has had no recent nausea, vomiting abdominal pain, cough, fever or chills, chest pain, change in bowel habits or dysuria. In the ED, tachycardic to 113 with otherwise normal vitals. Labs notable for blood glucose 780 with anion gap of 20 but with normal bicarb of 27.  Beta hydroxybutyric acid 0.96 and venous pH 7.35. WBC 18,000 and lactic acid 4.1 Creatinine 3.11(was 1.4 on 7/15  but has been uptrending) Urinalysis pending. EKG showed sinus at 96 CT head nonacute Chest x-ray clear Patient treated with LR boluses and started on an insulin  infusion Admission requested   9/14.  Sugars trending better.  Still on insulin  drip.  Will start empiric antibiotic.  Reordered short acting pain medication. 9/15.  Patient feeling better.  Creatinine improved down to 1.4.  Patient off insulin  drip and will increase Lantus  insulin  to her usual dose of 40 units at night 9/16.  Patient has blood pressure starting to come up.  Restarted Toprol  and Norvasc .  Assessment and Plan: * Hyperosmolar hyperglycemic state (HHS) (HCC) Initially on insulin  drip.  Will go back to 40 units of long-acting insulin  at night plus her sliding scale.  Will hold off on oral diabetic meds.  Acute metabolic encephalopathy Suspect secondary to HHS/DKA CT head nonacute.   Much improved  Lactic acidosis Leukocytosis I did give her a few days of empiric antibiotics but I do not think this is infection so antibiotics discontinued upon discharge.  Likely secondary to metformin.   Acute renal failure superimposed on stage 3b chronic kidney disease (HCC) Creatinine 3.11 on presentation down to 1.89.  Looks like last month had a creatinine around 2.1.  Acinar cell carcinoma of pancreas (HCC) Chronic pain Short acting pain medications Discontinue long-acting pain medication  Epistaxis Mild.  Will prescribe Afrin nasal spray if patient has a repeat nosebleed.  Spoke with daughter and advised pressure ice and head  forward if reoccurs.  Need to watch closely because she takes Eliquis .  Iron deficiency anemia Last hemoglobin 8.8.  Continue to monitor as outpatient.  Especially being on Eliquis .  Thrush Nystatin  swish and swallow.  Obesity (BMI 30-39.9) With current height and weight in computer BMI 34.89 which is class I obesity  Essential hypertension Blood pressure starting to rise restarted  Norvasc  this morning and we will restart Toprol .  If blood pressure at home is greater than 160/90 can restart olmesartan         Consultants: None Procedures performed: None Disposition: Home health Diet recommendation:  Cardiac and Carb modified diet DISCHARGE MEDICATION: Allergies as of 06/18/2024       Reactions   Diflucortolone    Doxycycline    Latex    Penicillins         Medication List     STOP taking these medications    aspirin EC 81 MG tablet   glipiZIDE 10 MG 24 hr tablet Commonly known as: GLUCOTROL XL   metFORMIN 500 MG tablet Commonly known as: GLUCOPHAGE   morphine  30 MG 12 hr tablet Commonly known as: MS CONTIN    naproxen 500 MG tablet Commonly known as: NAPROSYN   olmesartan 40 MG tablet Commonly known as: BENICAR   potassium chloride  10 MEQ CR capsule Commonly known as: MICRO-K    spironolactone 25 MG tablet Commonly known as: ALDACTONE       TAKE these medications    acetaminophen  325 MG suppository Commonly known as: TYLENOL  Place 650 mg rectally every 6 (six) hours as needed for mild pain or fever.   amLODipine  10 MG tablet Commonly known as: NORVASC  Take 10 mg by mouth daily.   apixaban  5 MG Tabs tablet Commonly known as: ELIQUIS  Take 5 mg by mouth 2 (two) times daily.   colestipol 1 g tablet Commonly known as: COLESTID Take 2 g by mouth daily. Take before meals separate from other medicines. Consider taking at lunch time or bedtime   cyanocobalamin  1000 MCG tablet Commonly known as: VITAMIN B12 Take 1,000 mcg by mouth daily.   dexamethasone 4 MG tablet Commonly known as: DECADRON Take 4 mg by mouth daily. As directed by oncology   DULoxetine  30 MG capsule Commonly known as: CYMBALTA  Take 30 mg by mouth daily.   esomeprazole 40 MG capsule Commonly known as: NEXIUM Take 40 mg by mouth daily at 12 noon.   ezetimibe 10 MG tablet Commonly known as: ZETIA Take 10 mg by mouth daily.   Ferrous Fumarate 324  (106 Fe) MG Tabs tablet Commonly known as: HEMOCYTE - 106 mg FE Take 1 tablet by mouth every other day.   FreeStyle Libre 14 Day Reader Espiridion by Does not apply route.   furosemide 40 MG tablet Commonly known as: LASIX Take 40 mg by mouth daily.   hydrOXYzine  25 MG tablet Commonly known as: ATARAX  Take 25 mg by mouth every 8 (eight) hours as needed for itching.   insulin  glargine 100 UNIT/ML injection Commonly known as: LANTUS  Inject 40 Units into the skin at bedtime.   insulin  lispro 100 UNIT/ML injection Commonly known as: HUMALOG Inject 10-30 Units into the skin 3 (three) times daily before meals. Inject 10 units with breakfast, lunch, and dinner plus correction 2 units per 50 above 200. Max TDD: 60 units   lidocaine 2 % solution Commonly known as: XYLOCAINE Use as directed 15 mLs in the mouth or throat 4 (four) times daily as needed for mouth pain.  lidocaine-prilocaine cream Commonly known as: EMLA Apply 1 Application topically as needed.   metoprolol  succinate 100 MG 24 hr tablet Commonly known as: TOPROL -XL Take 100 mg by mouth daily. Take with or immediately following a meal.   nystatin  100000 UNIT/ML suspension Commonly known as: MYCOSTATIN  Take 5 mLs (500,000 Units total) by mouth 4 (four) times daily for 10 days.   OLANZapine  5 MG tablet Commonly known as: ZYPREXA  Take 5 mg by mouth at bedtime.   ondansetron  8 MG tablet Commonly known as: ZOFRAN  Take 8 mg by mouth every 12 (twelve) hours as needed for nausea or vomiting.   oxyCODONE  5 MG immediate release tablet Commonly known as: Oxy IR/ROXICODONE  Take 5 mg by mouth every 4 (four) hours as needed for severe pain.   oxymetazoline  0.05 % nasal spray Commonly known as: AFRIN Place 4 sprays into both nostrils every 4 (four) hours as needed (nose bleed).   Praluent 150 MG/ML Soaj Generic drug: Alirocumab Inject 150 mg into the skin every 14 (fourteen) days.   prochlorperazine 10 MG tablet Commonly  known as: COMPAZINE Take 10 mg by mouth every 6 (six) hours as needed for nausea or vomiting.   temazepam  15 MG capsule Commonly known as: RESTORIL  Take 15 mg by mouth at bedtime as needed for sleep.   Zenpep  25000-79000 units Cpep Generic drug: Pancrelipase  (Lip-Prot-Amyl) Take 2-3 capsules by mouth 3 (three) times daily before meals. 3 caps AC, 2 before snacks        Follow-up Information     your medical doctor Follow up in 5 day(s).                 Discharge Exam: Filed Weights   06/15/24 2358 06/16/24 0247 06/16/24 2010  Weight: 85 kg 85 kg 92.2 kg   Physical Exam HENT:     Head: Normocephalic.     Mouth/Throat:     Comments: Positive for thrush Eyes:     General: Lids are normal.  Cardiovascular:     Rate and Rhythm: Normal rate and regular rhythm.     Heart sounds: Normal heart sounds, S1 normal and S2 normal.  Pulmonary:     Breath sounds: No decreased breath sounds, wheezing, rhonchi or rales.  Abdominal:     Palpations: Abdomen is soft.     Tenderness: There is no abdominal tenderness.  Musculoskeletal:     Right lower leg: No swelling.     Left lower leg: No swelling.  Skin:    General: Skin is warm.     Findings: No rash.  Neurological:     Mental Status: She is alert.      Condition at discharge: stable  The results of significant diagnostics from this hospitalization (including imaging, microbiology, ancillary and laboratory) are listed below for reference.   Imaging Studies: DG Chest Port 1 View Result Date: 06/17/2024 EXAM: 1 VIEW XRAY OF THE CHEST 06/17/2024 07:25:00 AM COMPARISON: 06/16/2024 CLINICAL HISTORY: Leukocytosis FINDINGS: LINES, TUBES AND DEVICES: Right chest port in place. LUNGS AND PLEURA: Low lung volumes. Left lung base atelectasis or opacity. No pneumothorax. HEART AND MEDIASTINUM: No acute abnormality of the cardiac and mediastinal silhouettes. BONES AND SOFT TISSUES: No acute osseous abnormality. IMPRESSION: 1. Low lung  volumes and left lung base atelectasis or opacity. Electronically signed by: Evalene Coho MD 06/17/2024 09:51 AM EDT RP Workstation: [id number]   CT Head Wo Contrast Result Date: 06/16/2024 CLINICAL DATA:  Mental status change, unknown cause EXAM: CT HEAD WITHOUT CONTRAST TECHNIQUE:  Contiguous axial images were obtained from the base of the skull through the vertex without intravenous contrast. RADIATION DOSE REDUCTION: This exam was performed according to the departmental dose-optimization program which includes automated exposure control, adjustment of the mA and/or kV according to patient size and/or use of iterative reconstruction technique. COMPARISON:  CT head 03/14/2023 FINDINGS: Brain: No evidence of large-territorial acute infarction. No parenchymal hemorrhage. No mass lesion. No extra-axial collection. No mass effect or midline shift. No hydrocephalus. Basilar cisterns are patent. Vascular: No hyperdense vessel. Atherosclerotic calcifications are present within the cavernous internal carotid arteries. Skull: No acute fracture or focal lesion. Sinuses/Orbits: Paranasal sinuses and mastoid air cells are clear. The orbits are unremarkable. Other: None. IMPRESSION: No acute intracranial abnormality. Electronically Signed   By: Morgane  Naveau M.D.   On: 06/16/2024 01:37   DG Chest Port 1 View Result Date: 06/16/2024 EXAM: 1 VIEW XRAY OF THE CHEST 06/16/2024 12:39:46 AM COMPARISON: None available. CLINICAL HISTORY: Questionable sepsis - evaluate for abnormality. PER ER NOTE; Patient's daughter reports patient fell tonight, did not hit her head, did not have LOC, but states that she has been disoriented since she fell. Patient's daughter is unsure if she's had her insulin  or not. Patient's daughter also reports tremors since yesterday. Patient CAOx2, unable to follow commands for stroke screen. LKW 2300. FINDINGS: LINES, TUBES AND DEVICES: Right chest port in place with tip in mid SVC. LUNGS AND  PLEURA: Low lung volumes. No focal pulmonary opacity. No pulmonary edema. No pleural effusion. No pneumothorax. HEART AND MEDIASTINUM: No acute abnormality of the cardiac and mediastinal silhouettes. BONES AND SOFT TISSUES: Elevated right hemidiaphragm. No acute osseous abnormality. IMPRESSION: 1. No acute findings. 2. Low lung volumes and elevated right hemidiaphragm. Electronically signed by: Dorethia Molt MD 06/16/2024 12:53 AM EDT RP Workstation: [id number]    Microbiology: Results for orders placed or performed during the hospital encounter of 06/15/24  MRSA Next Gen by PCR, Nasal     Status: None   Collection Time: 06/16/24  9:48 PM   Specimen: Nasal Mucosa; Nasal Swab  Result Value Ref Range Status   MRSA by PCR Next Gen NOT DETECTED NOT DETECTED Final    Comment: (NOTE) The GeneXpert MRSA Assay (FDA approved for NASAL specimens only), is one component of a comprehensive MRSA colonization surveillance program. It is not intended to diagnose MRSA infection nor to guide or monitor treatment for MRSA infections. Test performance is not FDA approved in patients less than 70 years old. Performed at [facility], [address] [place], [place] [address]     Labs: CBC: Recent Labs  Lab 06/16/24 0029 06/16/24 0633 06/17/24 0545  WBC 18.1* 15.0* 15.1*  NEUTROABS 12.6*  --   --   HGB 10.6* 9.1* 8.8*  HCT 32.8* 28.4* 26.9*  MCV 97.0 97.9 97.1  PLT 499* 460* 449*   Basic Metabolic Panel: Recent Labs  Lab 06/16/24 1422 06/16/24 1833 06/16/24 2247 06/17/24 0545 06/18/24 0552  NA 137 137 138 138 139  K 3.9 3.8 4.0 4.3 4.2  CL 95* 96* 98 101 103  CO2 27 29 29 28 23   GLUCOSE 252* 187* 184* 254* 245*  BUN 38* 37* 32* 27* 20  CREATININE 2.43* 2.13* 2.13* 1.84* 1.89*  CALCIUM 8.9 9.1 8.9 9.0 9.2   Liver Function Tests: Recent Labs  Lab 06/16/24 0029  AST 22  ALT 9  ALKPHOS 102  BILITOT 0.8  PROT 7.7  ALBUMIN  3.7   CBG: Recent Labs  Lab 06/17/24 1145  06/17/24 1703 06/17/24 2006 06/17/24 2122 06/18/24 0749  GLUCAP 350* 221* 229* 219* 224*    Discharge time spent: greater than 30 minutes.  Signed: Charlie Patterson, MD [facility] 06/18/2024

## 2024-06-18 NOTE — Assessment & Plan Note (Signed)
 Last hemoglobin 8.8.  Continue to monitor as outpatient.  Especially being on Eliquis .

## 2024-06-18 NOTE — Discharge Instructions (Addendum)
 If your BP remains over 160 systolic can restart olmesartan also

## 2024-06-18 NOTE — Progress Notes (Signed)
 OT Cancellation Note  Patient Details Name: Regina Barry MRN: [id number] DOB: 1961/05/19   Cancelled Treatment:    Reason Eval/Treat Not Completed: OT screened, no needs identified, will sign off. Order received, chart reviewed. Pt reports back to baseline functional independence. No skilled OT needs identified. Will sign off. Please re-consult if additional needs arise.   Elston Slot, M.S. OTR/L  06/18/24, 9:10 AM  ascom [phone]

## 2024-06-18 NOTE — Assessment & Plan Note (Signed)
 Mild.  Will prescribe Afrin nasal spray if patient has a repeat nosebleed.  Spoke with daughter and advised pressure ice and head forward if reoccurs.  Need to watch closely because she takes Eliquis .
